# Patient Record
Sex: Male | Born: 1980 | Race: White | Hispanic: No | Marital: Married | Smoking: Current every day smoker
Health system: Southern US, Community
[De-identification: ages and names within clinical notes are randomized; demographics above are authoritative.]

## PROBLEM LIST (undated history)

## (undated) DIAGNOSIS — Z720 Tobacco use: Secondary | ICD-10-CM

## (undated) DIAGNOSIS — S0990XA Unspecified injury of head, initial encounter: Secondary | ICD-10-CM

## (undated) DIAGNOSIS — E785 Hyperlipidemia, unspecified: Secondary | ICD-10-CM

## (undated) DIAGNOSIS — I2510 Atherosclerotic heart disease of native coronary artery without angina pectoris: Secondary | ICD-10-CM

## (undated) DIAGNOSIS — I119 Hypertensive heart disease without heart failure: Secondary | ICD-10-CM

## (undated) DIAGNOSIS — I10 Essential (primary) hypertension: Secondary | ICD-10-CM

## (undated) HISTORY — DX: Essential (primary) hypertension: I10

## (undated) HISTORY — DX: Hyperlipidemia, unspecified: E78.5

## (undated) HISTORY — DX: Atherosclerotic heart disease of native coronary artery without angina pectoris: I25.10

## (undated) HISTORY — DX: Tobacco use: Z72.0

---

## 2006-06-03 ENCOUNTER — Emergency Department (HOSPITAL_COMMUNITY): Admission: EM | Admit: 2006-06-03 | Discharge: 2006-06-03 | Payer: Self-pay | Admitting: Emergency Medicine

## 2011-11-27 ENCOUNTER — Encounter: Payer: Self-pay | Admitting: *Deleted

## 2011-12-10 ENCOUNTER — Encounter: Payer: Self-pay | Admitting: Cardiology

## 2011-12-10 ENCOUNTER — Ambulatory Visit (INDEPENDENT_AMBULATORY_CARE_PROVIDER_SITE_OTHER): Payer: 59 | Admitting: Cardiology

## 2011-12-10 ENCOUNTER — Encounter: Payer: Self-pay | Admitting: *Deleted

## 2011-12-10 VITALS — BP 150/108 | HR 84 | Ht 71.0 in | Wt 193.0 lb

## 2011-12-10 DIAGNOSIS — F17200 Nicotine dependence, unspecified, uncomplicated: Secondary | ICD-10-CM

## 2011-12-10 DIAGNOSIS — R079 Chest pain, unspecified: Secondary | ICD-10-CM

## 2011-12-10 DIAGNOSIS — I10 Essential (primary) hypertension: Secondary | ICD-10-CM

## 2011-12-10 DIAGNOSIS — E785 Hyperlipidemia, unspecified: Secondary | ICD-10-CM

## 2011-12-10 MED ORDER — LISINOPRIL 20 MG PO TABS: 20.0000 mg | ORAL_TABLET | Freq: Every day | ORAL | Status: DC

## 2011-12-10 MED ORDER — BUPROPION HCL ER (SR) 150 MG PO TB12: 150.0000 mg | ORAL_TABLET | Freq: Two times a day (BID) | ORAL | Status: DC

## 2011-12-10 NOTE — Patient Instructions (Addendum)
Start lisinopril 20mg  daily.  Start aspirin 81mg  daily. Take this with food.  Start Welbutrin 150mg  two times a day to help you stop smoking. Take 1 a day for three  days,then increase to 1 two times a day. Stay on 1 two times a day.  Your physician recommends that you have  a FASTING lipid profile /BMET/CBC-d/ PT/INR on Tuesday September 3,2013. You have the order. Please fax the results to Dr Shirlee Latch [phone].   Your physician has requested that you have a cardiac catheterization. Cardiac catheterization is used to diagnose and/or treat various heart conditions. Doctors may recommend this procedure for a number of different reasons. The most common reason is to evaluate chest pain. Chest pain can be a symptom of coronary artery disease (CAD), and cardiac catheterization can show whether plaque is narrowing or blocking your heart's arteries. This procedure is also used to evaluate the valves, as well as measure the blood flow and oxygen levels in different parts of your heart. For further information please visit [email or web address] Please follow instruction sheet, as given. Friday September 6,2013.  Your physician recommends that you schedule a follow-up appointment in about  3 weeks with Dr Shirlee Latch.

## 2011-12-11 DIAGNOSIS — E785 Hyperlipidemia, unspecified: Secondary | ICD-10-CM | POA: Insufficient documentation

## 2011-12-11 DIAGNOSIS — R079 Chest pain, unspecified: Secondary | ICD-10-CM | POA: Insufficient documentation
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
## 2011-12-11 NOTE — Assessment & Plan Note (Signed)
BP is running high.  He has a strong FH of HTN and has had a diagnosis of HTN since around the age of 63.   He has not been able to tolerate HCTZ due to erectile dysfunction.  I am going to have him start lisinopril 20 mg daily.  BMET in 1 week.

## 2011-12-11 NOTE — Assessment & Plan Note (Signed)
Active smoker.  He will try Wellbutrin to see if this can help him quit.

## 2011-12-11 NOTE — Assessment & Plan Note (Signed)
Patient has exertional chest pain resolving with rest.  He had 1 episode of more severe chest pain at rest last week.  He has a very strong family history of premature CAD (multiple family members with MIs in their 30s).  He smokes and has untreated HTN and hyperlipidemia.  We had a long discussion of the pros and cons and decided on left heart cath for a definitive evaluation of the coronaries.  I will plan LHC with a radial approach next week.  He should take ASA 81 mg daily.

## 2011-12-11 NOTE — Assessment & Plan Note (Signed)
I will arrange to check lipids/LFTs.

## 2011-12-16 ENCOUNTER — Encounter: Payer: Self-pay | Admitting: Cardiology

## 2011-12-18 ENCOUNTER — Other Ambulatory Visit: Payer: Self-pay | Admitting: Cardiology

## 2011-12-18 DIAGNOSIS — R079 Chest pain, unspecified: Secondary | ICD-10-CM

## 2011-12-19 ENCOUNTER — Encounter (HOSPITAL_BASED_OUTPATIENT_CLINIC_OR_DEPARTMENT_OTHER): Payer: Self-pay | Admitting: *Deleted

## 2011-12-19 ENCOUNTER — Inpatient Hospital Stay (HOSPITAL_BASED_OUTPATIENT_CLINIC_OR_DEPARTMENT_OTHER)
Admission: RE | Admit: 2011-12-19 | Discharge: 2011-12-19 | Disposition: A | Discharge status: Home / Self Care | Payer: 59 | Source: Ambulatory Visit | Attending: Cardiology | Admitting: Cardiology

## 2011-12-19 ENCOUNTER — Encounter (HOSPITAL_BASED_OUTPATIENT_CLINIC_OR_DEPARTMENT_OTHER)
Admission: RE | Disposition: A | Discharge status: Home / Self Care | Payer: Self-pay | Source: Ambulatory Visit | Attending: Cardiology

## 2011-12-19 DIAGNOSIS — E785 Hyperlipidemia, unspecified: Secondary | ICD-10-CM | POA: Insufficient documentation

## 2011-12-19 DIAGNOSIS — Z8249 Family history of ischemic heart disease and other diseases of the circulatory system: Secondary | ICD-10-CM | POA: Insufficient documentation

## 2011-12-19 DIAGNOSIS — I2510 Atherosclerotic heart disease of native coronary artery without angina pectoris: Secondary | ICD-10-CM

## 2011-12-19 DIAGNOSIS — I10 Essential (primary) hypertension: Secondary | ICD-10-CM | POA: Insufficient documentation

## 2011-12-19 DIAGNOSIS — R079 Chest pain, unspecified: Secondary | ICD-10-CM | POA: Insufficient documentation

## 2011-12-19 HISTORY — PX: CARDIAC CATHETERIZATION: SHX172

## 2011-12-19 SURGERY — JV LEFT HEART CATHETERIZATION WITH CORONARY ANGIOGRAM
Anesthesia: LOCAL

## 2011-12-19 SURGERY — JV LEFT HEART CATHETERIZATION WITH CORONARY ANGIOGRAM
Anesthesia: Moderate Sedation

## 2011-12-19 MED ORDER — SODIUM CHLORIDE 0.9 % IJ SOLN: 3.0000 mL | Freq: Two times a day (BID) | INTRAMUSCULAR | Status: DC

## 2011-12-19 MED ORDER — ACETAMINOPHEN 325 MG PO TABS: 650.0000 mg | ORAL_TABLET | ORAL | Status: DC | PRN

## 2011-12-19 MED ORDER — ONDANSETRON HCL 4 MG/2ML IJ SOLN: 4.0000 mg | Freq: Four times a day (QID) | INTRAMUSCULAR | Status: DC | PRN

## 2011-12-19 MED ORDER — ASPIRIN 81 MG PO CHEW
324.0000 mg | CHEWABLE_TABLET | ORAL | Status: AC
  Administered 2011-12-19: 243 mg via ORAL

## 2011-12-19 MED ORDER — SODIUM CHLORIDE 0.9 % IV SOLN
INTRAVENOUS | Status: DC
  Administered 2011-12-19: 07:00:00 via INTRAVENOUS

## 2011-12-19 MED ORDER — SODIUM CHLORIDE 0.9 % IV SOLN: 250.0000 mL | INTRAVENOUS | Status: DC | PRN

## 2011-12-19 MED ORDER — SODIUM CHLORIDE 0.9 % IV SOLN: INTRAVENOUS | Status: DC

## 2011-12-19 MED ORDER — SODIUM CHLORIDE 0.9 % IJ SOLN: 3.0000 mL | INTRAMUSCULAR | Status: DC | PRN

## 2011-12-19 NOTE — Interval H&P Note (Signed)
History and Physical Interval Note:  12/19/2011 7:39 AM  Wesley Newman  has presented today for surgery, with the diagnosis of CAD  The various methods of treatment have been discussed with the patient and family. After consideration of risks, benefits and other options for treatment, the patient has consented to  Procedure(s) (LRB) with comments: JV LEFT HEART CATHETERIZATION WITH CORONARY ANGIOGRAM (N/A) as a surgical intervention .  The patient's history has been reviewed, patient examined, no change in status, stable for surgery.  I have reviewed the patient's chart and labs.  Questions were answered to the patient's satisfaction.     Jessa Stinson [employer]

## 2011-12-19 NOTE — OR Nursing (Signed)
Discharge instructions reviewed and signed, pt stated understanding, ambulated in hall without difficulty, site level 0, transported to wife's car via wheelchair 

## 2011-12-19 NOTE — OR Nursing (Signed)
TR Band removed, site intact level 0, pressure dressing and wrist splint applied

## 2011-12-19 NOTE — OR Nursing (Signed)
Meal served 

## 2011-12-19 NOTE — CV Procedure (Signed)
   Cardiac Catheterization Procedure Note  Name: Wesley Newman MRN: [id number] DOB: Mar 13, 1981  Procedure: Left Heart Cath, Selective Coronary Angiography, LV angiography  Indication: Exertional chest pain, strong family history of CAD.    Procedural Details: The right wrist was prepped, draped, and anesthetized with 1% lidocaine. Using the modified Seldinger technique, a 5 French sheath was introduced into the right radial artery. 3 mg of verapamil and 200 mcg IV nitroglycerin was administered through the sheath, weight-based unfractionated heparin was administered intravenously. Standard Judkins catheters were used for selective coronary angiography and left ventriculography. Catheter exchanges were performed over an exchange length guidewire. There were no immediate procedural complications. A TR band was used for radial hemostasis at the completion of the procedure.  The patient was transferred to the post catheterization recovery area for further monitoring.  Procedural Findings: Hemodynamics: AO 107/88 LV 111/17  Coronary angiography: Coronary dominance: right  Left mainstem: No significant disease.  Left anterior descending (LAD): Small caliber with some dilation after injection of 200 mcg intracoronary NTG.  The LAD itself has mild luminal irregularities.  There was a moderate 1st diagonal with 30-40% mid vessel stenosis.  There was a small branch off the 1st diagonal with 80% ostial stenosis.   Left circumflex (LCx): Mild luminal irregularities.   Right coronary artery (RCA): Mild luminal irregularities.  Left ventriculography: Left ventricular systolic function is normal, LVEF is estimated at 55-60%, there is no significant mitral regurgitation or wall motion abnormality in the RAO projection.  Final Conclusions:  No obstructive disease in the major vessels.  There was an 80% stenosis in a small branch vessel off D1.    Recommendations:  Nonobstructive coronary disease.   Patient needs aggressive risk factor modification including statin (will start atorvastatin 40 mg daily) and smoking cessation.   Marca Ancona 12/19/2011, 8:20 AM
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
## 2011-12-20 ENCOUNTER — Telehealth: Payer: Self-pay | Admitting: Physician Assistant

## 2011-12-20 NOTE — Telephone Encounter (Signed)
Patient called because his arm with swelling. He had a cath yesterday and was discharged home with some type of band around his wrist in place and with the splint on. Today, he has noted some generalized swelling in his forearm proximal to the band. He wanted to know when he could take the band off and when he could take the splint off.  Advised the patient he should take the band off immediately. Advised him it was okay to take the splint off as well. Advised him he should start moving his wrist around very gently. Advised him that if the swelling started going down very quickly then nothing further needed to be done at this time. However, advised him that if he noted any more swelling, any pallor, any numbness or tingling or any other new symptoms, he should come to [facility] hospital immediately. The patient stated he would comply.

## 2011-12-23 ENCOUNTER — Encounter: Payer: Self-pay | Admitting: *Deleted

## 2012-01-01 ENCOUNTER — Encounter: Payer: Self-pay | Admitting: Cardiology

## 2012-01-01 ENCOUNTER — Ambulatory Visit (INDEPENDENT_AMBULATORY_CARE_PROVIDER_SITE_OTHER): Payer: 59 | Admitting: Cardiology

## 2012-01-01 VITALS — BP 140/83 | HR 73 | Resp 18 | Ht 71.0 in | Wt 192.8 lb

## 2012-01-01 DIAGNOSIS — I2510 Atherosclerotic heart disease of native coronary artery without angina pectoris: Secondary | ICD-10-CM | POA: Insufficient documentation

## 2012-01-01 DIAGNOSIS — I25810 Atherosclerosis of coronary artery bypass graft(s) without angina pectoris: Secondary | ICD-10-CM

## 2012-01-01 DIAGNOSIS — I10 Essential (primary) hypertension: Secondary | ICD-10-CM

## 2012-01-01 DIAGNOSIS — F17200 Nicotine dependence, unspecified, uncomplicated: Secondary | ICD-10-CM

## 2012-01-01 DIAGNOSIS — E785 Hyperlipidemia, unspecified: Secondary | ICD-10-CM
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
## 2012-01-01 NOTE — Patient Instructions (Addendum)
Your physician recommends that you return for a FASTING lipid profile /liver profile /BMET in 2 months. You can have this done at the [facility] in [place]. I have given you an order for this today. Please fax the results to [phone] Dr Shirlee Latch.  Your physician wants you to follow-up in: 6 months with Dr Shirlee Latch in the [facility] office.  You will receive a reminder letter in the mail two months in advance. If you don't receive a letter, please call our office to schedule the follow-up appointment.

## 2012-04-29 ENCOUNTER — Ambulatory Visit: Payer: 59 | Admitting: Nurse Practitioner

## 2012-05-06 ENCOUNTER — Ambulatory Visit (INDEPENDENT_AMBULATORY_CARE_PROVIDER_SITE_OTHER): Payer: 59 | Admitting: Nurse Practitioner

## 2012-05-06 ENCOUNTER — Other Ambulatory Visit: Payer: Self-pay | Admitting: *Deleted

## 2012-05-06 ENCOUNTER — Encounter: Payer: Self-pay | Admitting: Nurse Practitioner

## 2012-05-06 VITALS — BP 137/76 | HR 82 | Wt 194.0 lb

## 2012-05-06 DIAGNOSIS — Z720 Tobacco use: Secondary | ICD-10-CM

## 2012-05-06 DIAGNOSIS — R079 Chest pain, unspecified: Secondary | ICD-10-CM

## 2012-05-06 DIAGNOSIS — I10 Essential (primary) hypertension: Secondary | ICD-10-CM

## 2012-05-06 DIAGNOSIS — I2510 Atherosclerotic heart disease of native coronary artery without angina pectoris: Secondary | ICD-10-CM

## 2012-05-06 DIAGNOSIS — E785 Hyperlipidemia, unspecified: Secondary | ICD-10-CM

## 2012-05-06 DIAGNOSIS — F17200 Nicotine dependence, unspecified, uncomplicated: Secondary | ICD-10-CM

## 2012-05-06 NOTE — Progress Notes (Signed)
Patient Name: Wesley Newman Date of Encounter: 05/06/2012  Primary Care Provider:  No primary provider on file. Primary Cardiologist:  Golden Circle, MD  Patient Profile  32 y/o male h/o chest pain and nonobs cad on cath who presents with atypical chest pain.  Problem List   Past Medical History  Diagnosis Date  . Essential hypertension, benign   . CAD (coronary artery disease) nonobs    a. 9.2013 Cath: LAD 30-40, D1 80 ost, otw nl, nl EF  . Atypical chest pain   . Hyperlipidemia   . Tobacco abuse    Past Surgical History  Procedure Date  . Cardiac catheterization 12/19/11    left heart with coronary angiogram    Allergies  No Known Allergies  HPI  32 year old male with the above problem list.  He is status post catheterization in September of last year revealing nonobstructive CAD.  Although he initially quit smoking surrounding his catheterization, he has resumed and is currently smoking a half a pack a day.  He reports compliance with his medications.  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  Over the past week, patient has been experiencing intermittent left lateral lower chest sharp shooting pain that generally occurs at rest and moves along the axilla and down his left arm without associated symptoms, lasting a second or 2 and resolving spontaneously.  He has also noticed that when he has his left arm in an elevated position like when he is driving down the road with his arm up on the windowsill of his truck, he develops paresthesias of the left arm.  This has also occurred when his arm was positioned above his head while he's fallen asleep.  He denies dyspnea exertion, palpitations, PND, orthopnea, dizziness, syncope, edema, or early satiety.  Home Medications  Prior to Admission medications   Medication Sig Start Date End Date Taking? Authorizing Provider  aspirin EC 81 MG tablet Take 1  tablet (81 mg total) by mouth daily. 12/10/11  Yes Laurey Morale, MD  atorvastatin (LIPITOR) 40 MG tablet Take 40 mg by mouth daily.   Yes Historical Provider, MD  lisinopril (PRINIVIL,ZESTRIL) 20 MG tablet Take 1 tablet (20 mg total) by mouth daily. 12/10/11 12/09/12 Yes Laurey Morale, MD   Review of Systems  Chest pain and left arm paresthesias as outlined above otherwise all systems reviewed and are otherwise negative except as noted above.  Physical Exam  Blood pressure 137/76, pulse 82, weight 194 lb (87.998 kg).  General: Pleasant, NAD Psych: Normal affect. Neuro: Alert and oriented X 3. Moves all extremities spontaneously. HEENT: Normal  Neck: Supple without bruits or JVD. Lungs:  Resp regular and unlabored, CTA. Heart: RRR no s3, s4, or murmurs. Abdomen: Soft, non-tender, non-distended, BS + x 4.  Extremities: No clubbing, cyanosis or edema. DP/PT/Radials 2+ and equal bilaterally.  Accessory Clinical Findings  ECG - regular sinus rhythm, 82, no acute ST-T changes.  Assessment & Plan  1.  Chest pain: Patient presents with a one-week history of intermittent sharp and shooting/fleeting chest pain.  He had catheterization in September which showed nonobstructive LAD and small vessel diagonal disease.  He has significant family history of coronary artery disease with a sister has been stented and a brother who was recently suffered cardiac arrest and subsequent stenting.  Both siblings have symptoms that started out as sharp chest pain and  his sister was found to have abnormally rapid progression of her coronary disease. As result, patient is very concerned about his symptoms despite the atypical nature.  Given his significant family history and recent diagnostic catheterization, I have discussed his case with his regular cardiologist, Dr. Shirlee Latch, and we will obtain an exercise Myoview to rule out ischemia.  He remains on aspirin and Statin therapy.  2.  Left arm paresthesias: This  is positional in nature.  I would question if he has cervical radiculopathy I recommended that he follow up with his primary care provider.  3.  Hyperlipidemia: Continue statin therapy.  4.  Ongoing tobacco abuse: Smoking cessation strongly advised.  5.  Disposition: Followup stress testing and then he'll see Dr. Jearld Pies in 3-4 weeks.    Nicolasa Ducking, NP 05/06/2012, 3:51 PM

## 2012-05-06 NOTE — Patient Instructions (Addendum)
Your physician has requested that you have en exercise stress myoview. For further information please visit [email or web address] Please follow instruction sheet, as given.   Your physician recommends that you schedule a follow-up appointment in: 3 weeks with Dr Edward Jolly and Cholesterol Control Diet Cholesterol levels in your body are determined significantly by your diet. Cholesterol levels may also be related to heart disease. The following material helps to explain this relationship and discusses what you can do to help keep your heart healthy. Not all cholesterol is bad. Low-density lipoprotein (LDL) cholesterol is the "bad" cholesterol. It may cause fatty deposits to build up inside your arteries. High-density lipoprotein (HDL) cholesterol is "good." It helps to remove the "bad" LDL cholesterol from your blood. Cholesterol is a very important risk factor for heart disease. Other risk factors are high blood pressure, smoking, stress, heredity, and weight. The heart muscle gets its supply of blood through the coronary arteries. If your LDL cholesterol is high and your HDL cholesterol is low, you are at risk for having fatty deposits build up in your coronary arteries. This leaves less room through which blood can flow. Without sufficient blood and oxygen, the heart muscle cannot function properly and you may feel chest pains (angina pectoris). When a coronary artery closes up entirely, a part of the heart muscle may die causing a heart attack (myocardial infarction). CHECKING CHOLESTEROL When your caregiver sends your blood to a lab to be examined for cholesterol, a complete lipid (fat) profile may be done. With this test, the total amount of cholesterol and levels of LDL and HDL are determined. Triglycerides are a type of fat that circulates in the blood. They can also be used to determine heart disease risk. The list below describes what the numbers should be: Test: Total Cholesterol.  Less than 200  mg/dl. Test: LDL "bad cholesterol."  Less than 100 mg/dl.  Less than 70 mg/dl if you are at very high risk of a heart attack or sudden cardiac death. Test: HDL "good cholesterol."  Greater than 50 mg/dl for women.  Greater than 40 mg/dl for men. Test: Triglycerides.  Less than 150 mg/dl. CONTROLLING CHOLESTEROL WITH DIET Although exercise and lifestyle factors are important, your diet is key. That is because certain foods are known to raise cholesterol and others to lower it. The goal is to balance foods for their effect on cholesterol and more importantly, to replace saturated and trans fat with other types of fat, such as monounsaturated fat, polyunsaturated fat, and omega-3 fatty acids. On average, a person should consume no more than 15 to 17 g of saturated fat daily. Saturated and trans fats are considered "bad" fats, and they will raise LDL cholesterol. Saturated fats are primarily found in animal products such as meats, butter, and cream. However, that does not mean you need to give up all your favorite foods. Today, there are good tasting, low-fat, low-cholesterol substitutes for most of the things you like to eat. Choose low-fat or nonfat alternatives. Choose round or loin cuts of red meat. These types of cuts are lowest in fat and cholesterol. Chicken (without the skin), fish, veal, and ground [place] breast are great choices. Eliminate fatty meats, such as hot dogs and salami. Even shellfish have little or no saturated fat. Have a 3 oz (85 g) portion when you eat lean meat, poultry, or fish. Trans fats are also called "partially hydrogenated oils." They are oils that have been scientifically manipulated so that they are solid at room  temperature resulting in a longer shelf life and improved taste and texture of foods in which they are added. Trans fats are found in stick margarine, some tub margarines, cookies, crackers, and baked goods.  When baking and cooking, oils are a great  substitute for butter. The monounsaturated oils are especially beneficial since it is believed they lower LDL and raise HDL. The oils you should avoid entirely are saturated tropical oils, such as coconut and palm.  Remember to eat a lot from food groups that are naturally free of saturated and trans fat, including fish, fruit, vegetables, beans, grains (barley, rice, couscous, bulgur wheat), and pasta (without cream sauces).  IDENTIFYING FOODS THAT LOWER CHOLESTEROL  Soluble fiber may lower your cholesterol. This type of fiber is found in fruits such as apples, vegetables such as broccoli, potatoes, and carrots, legumes such as beans, peas, and lentils, and grains such as barley. Foods fortified with plant sterols (phytosterol) may also lower cholesterol. You should eat at least 2 g per day of these foods for a cholesterol lowering effect.  Read package labels to identify low-saturated fats, trans fat free, and low-fat foods at the supermarket. Select cheeses that have only 2 to 3 g saturated fat per ounce. Use a heart-healthy tub margarine that is free of trans fats or partially hydrogenated oil. When buying baked goods (cookies, crackers), avoid partially hydrogenated oils. Breads and muffins should be made from whole grains (whole-wheat or whole oat flour, instead of "flour" or "enriched flour"). Buy non-creamy canned soups with reduced salt and no added fats.  FOOD PREPARATION TECHNIQUES  Never deep-fry. If you must fry, either stir-fry, which uses very little fat, or use non-stick cooking sprays. When possible, broil, bake, or roast meats, and steam vegetables. Instead of putting butter or margarine on vegetables, use lemon and herbs, applesauce, and cinnamon (for squash and sweet potatoes), nonfat yogurt, salsa, and low-fat dressings for salads.  LOW-SATURATED FAT / LOW-FAT FOOD SUBSTITUTES Meats / Saturated Fat (g)  Avoid: Steak, marbled (3 oz/85 g) / 11 g  Choose: Steak, lean (3 oz/85 g) / 4  g  Avoid: Hamburger (3 oz/85 g) / 7 g  Choose: Hamburger, lean (3 oz/85 g) / 5 g  Avoid: Ham (3 oz/85 g) / 6 g  Choose: Ham, lean cut (3 oz/85 g) / 2.4 g  Avoid: Chicken, with skin, dark meat (3 oz/85 g) / 4 g  Choose: Chicken, skin removed, dark meat (3 oz/85 g) / 2 g  Avoid: Chicken, with skin, light meat (3 oz/85 g) / 2.5 g  Choose: Chicken, skin removed, light meat (3 oz/85 g) / 1 g Dairy / Saturated Fat (g)  Avoid: Whole milk (1 cup) / 5 g  Choose: Low-fat milk, 2% (1 cup) / 3 g  Choose: Low-fat milk, 1% (1 cup) / 1.5 g  Choose: Skim milk (1 cup) / 0.3 g  Avoid: Hard cheese (1 oz/28 g) / 6 g  Choose: Skim milk cheese (1 oz/28 g) / 2 to 3 g  Avoid: Cottage cheese, 4% fat (1 cup) / 6.5 g  Choose: Low-fat cottage cheese, 1% fat (1 cup) / 1.5 g  Avoid: Ice cream (1 cup) / 9 g  Choose: Sherbet (1 cup) / 2.5 g  Choose: Nonfat frozen yogurt (1 cup) / 0.3 g  Choose: Frozen fruit bar / trace  Avoid: Whipped cream (1 tbs) / 3.5 g  Choose: Nondairy whipped topping (1 tbs) / 1 g Condiments / Saturated Fat (g)  Avoid: Mayonnaise (1 tbs) / 2 g  Choose: Low-fat mayonnaise (1 tbs) / 1 g  Avoid: Butter (1 tbs) / 7 g  Choose: Extra light margarine (1 tbs) / 1 g  Avoid: Coconut oil (1 tbs) / 11.8 g  Choose: Olive oil (1 tbs) / 1.8 g  Choose: Corn oil (1 tbs) / 1.7 g  Choose: Safflower oil (1 tbs) / 1.2 g  Choose: Sunflower oil (1 tbs) / 1.4 g  Choose: Soybean oil (1 tbs) / 2.4 g  Choose: Canola oil (1 tbs) / 1 g Document Released: 03/31/2005 Document Revised: 06/23/2011 Document Reviewed: 09/19/2010 [facility] Patient Information 2013 [place], [place].

## 2012-05-14 ENCOUNTER — Telehealth: Payer: Self-pay | Admitting: Nurse Practitioner

## 2012-05-14 NOTE — Telephone Encounter (Signed)
Called and left message asking Mr. Wesley Newman to call office to schedule his exercise stress myoview.

## 2012-05-20 NOTE — Telephone Encounter (Signed)
Pt has [place] [facility].  No precert required °

## 2012-05-20 NOTE — Telephone Encounter (Signed)
Spoke with Wesley Newman. His exercise stress myoview has been scheduled for 05-27-12 @ [facility] 730am. Wesley Newman Has been notified.   Checking percert for exercise stress myoview. 05-27-12 @ [facility]

## 2012-06-22 ENCOUNTER — Ambulatory Visit: Payer: 59 | Admitting: Cardiology

## 2012-06-25 DIAGNOSIS — R079 Chest pain, unspecified: Secondary | ICD-10-CM

## 2012-06-29 ENCOUNTER — Encounter: Payer: Self-pay | Admitting: *Deleted

## 2012-08-25 ENCOUNTER — Ambulatory Visit: Payer: 59 | Admitting: Cardiology

## 2013-07-26 ENCOUNTER — Encounter (HOSPITAL_COMMUNITY): Payer: Self-pay | Admitting: Emergency Medicine

## 2013-07-26 ENCOUNTER — Emergency Department (HOSPITAL_COMMUNITY): Payer: 59

## 2013-07-26 ENCOUNTER — Emergency Department (HOSPITAL_COMMUNITY)
Admission: EM | Admit: 2013-07-26 | Discharge: 2013-07-26 | Disposition: A | Discharge status: Home / Self Care | Payer: 59 | Attending: Emergency Medicine | Admitting: Emergency Medicine

## 2013-07-26 DIAGNOSIS — I2510 Atherosclerotic heart disease of native coronary artery without angina pectoris: Secondary | ICD-10-CM | POA: Insufficient documentation

## 2013-07-26 DIAGNOSIS — IMO0002 Reserved for concepts with insufficient information to code with codable children: Secondary | ICD-10-CM | POA: Insufficient documentation

## 2013-07-26 DIAGNOSIS — Z7982 Long term (current) use of aspirin: Secondary | ICD-10-CM | POA: Insufficient documentation

## 2013-07-26 DIAGNOSIS — Z79899 Other long term (current) drug therapy: Secondary | ICD-10-CM | POA: Insufficient documentation

## 2013-07-26 DIAGNOSIS — S0990XA Unspecified injury of head, initial encounter: Secondary | ICD-10-CM | POA: Insufficient documentation

## 2013-07-26 DIAGNOSIS — Y9289 Other specified places as the place of occurrence of the external cause: Secondary | ICD-10-CM | POA: Insufficient documentation

## 2013-07-26 DIAGNOSIS — Z23 Encounter for immunization: Secondary | ICD-10-CM | POA: Insufficient documentation

## 2013-07-26 DIAGNOSIS — Z9889 Other specified postprocedural states: Secondary | ICD-10-CM | POA: Insufficient documentation

## 2013-07-26 DIAGNOSIS — Y9389 Activity, other specified: Secondary | ICD-10-CM | POA: Insufficient documentation

## 2013-07-26 DIAGNOSIS — S0180XA Unspecified open wound of other part of head, initial encounter: Secondary | ICD-10-CM | POA: Insufficient documentation

## 2013-07-26 DIAGNOSIS — S0181XA Laceration without foreign body of other part of head, initial encounter: Secondary | ICD-10-CM

## 2013-07-26 DIAGNOSIS — E785 Hyperlipidemia, unspecified: Secondary | ICD-10-CM | POA: Insufficient documentation

## 2013-07-26 DIAGNOSIS — I10 Essential (primary) hypertension: Secondary | ICD-10-CM | POA: Insufficient documentation

## 2013-07-26 DIAGNOSIS — F17200 Nicotine dependence, unspecified, uncomplicated: Secondary | ICD-10-CM | POA: Insufficient documentation

## 2013-07-26 MED ORDER — LIDOCAINE-EPINEPHRINE-TETRACAINE (LET) SOLUTION
NASAL | Status: AC
  Filled 2013-07-26: qty 3

## 2013-07-26 MED ORDER — LIDOCAINE-EPINEPHRINE-TETRACAINE (LET) SOLUTION
3.0000 mL | Freq: Once | NASAL | Status: AC
  Administered 2013-07-26: 3 mL via TOPICAL

## 2013-07-26 MED ORDER — TETANUS-DIPHTH-ACELL PERTUSSIS 5-2.5-18.5 LF-MCG/0.5 IM SUSP
0.5000 mL | Freq: Once | INTRAMUSCULAR | Status: AC
Start: 2013-07-26 — End: 2013-07-26
  Administered 2013-07-26: 0.5 mL via INTRAMUSCULAR
  Filled 2013-07-26: qty 0.5

## 2013-07-26 MED ORDER — ACETAMINOPHEN 500 MG PO TABS
1000.0000 mg | ORAL_TABLET | Freq: Once | ORAL | Status: AC
  Administered 2013-07-26: 1000 mg via ORAL
  Filled 2013-07-26: qty 2

## 2013-07-26 MED ORDER — LIDOCAINE-EPINEPHRINE (PF) 2 %-1:200000 IJ SOLN
INTRAMUSCULAR | Status: AC
  Administered 2013-07-26: 18:00:00
  Filled 2013-07-26: qty 20

## 2013-07-26 NOTE — ED Notes (Addendum)
Laceration to lt forehead, No LOC, alert, ambulatory into ER, PERL, EOMI
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
## 2013-07-26 NOTE — Discharge Instructions (Signed)
Facial Laceration ° A facial laceration is a cut on the face. These injuries can be painful and cause bleeding. Lacerations usually heal quickly, but they need special care to reduce scarring. °DIAGNOSIS  °Your health care provider will take a medical history, ask for details about how the injury occurred, and examine the wound to determine how deep the cut is. °TREATMENT  °Some facial lacerations may not require closure. Others may not be able to be closed because of an increased risk of infection. The risk of infection and the chance for successful closure will depend on various factors, including the amount of time since the injury occurred. °The wound may be cleaned to help prevent infection. If closure is appropriate, pain medicines may be given if needed. Your health care provider will use stitches (sutures), wound glue (adhesive), or skin adhesive strips to repair the laceration. These tools bring the skin edges together to allow for faster healing and a better cosmetic outcome. If needed, you may also be given a tetanus shot. °HOME CARE INSTRUCTIONS °· Only take over-the-counter or prescription medicines as directed by your health care provider. °· Follow your health care provider's instructions for wound care. These instructions will vary depending on the technique used for closing the wound. °For Sutures: °· Keep the wound clean and dry.   °· If you were given a bandage (dressing), you should change it at least once a day. Also change the dressing if it becomes wet or dirty, or as directed by your health care provider.   °· Wash the wound with soap and water 2 times a day. Rinse the wound off with water to remove all soap. Pat the wound dry with a clean towel.   °· After cleaning, apply a thin layer of the antibiotic ointment recommended by your health care provider. This will help prevent infection and keep the dressing from sticking.   °· You may shower as usual after the first 24 hours. Do not soak the  wound in water until the sutures are removed.   °· Get your sutures removed as directed by your health care provider. With facial lacerations, sutures should usually be taken out after 4 5 days to avoid stitch marks.   °· Wait a few days after your sutures are removed before applying any makeup. °For Skin Adhesive Strips: °· Keep the wound clean and dry.   °· Do not get the skin adhesive strips wet. You may bathe carefully, using caution to keep the wound dry.   °· If the wound gets wet, pat it dry with a clean towel.   °· Skin adhesive strips will fall off on their own. You may trim the strips as the wound heals. Do not remove skin adhesive strips that are still stuck to the wound. They will fall off in time.   °For Wound Adhesive: °· You may briefly wet your wound in the shower or bath. Do not soak or scrub the wound. Do not swim. Avoid periods of heavy sweating until the skin adhesive has fallen off on its own. After showering or bathing, gently pat the wound dry with a clean towel.   °· Do not apply liquid medicine, cream medicine, ointment medicine, or makeup to your wound while the skin adhesive is in place. This may loosen the film before your wound is healed.   °· If a dressing is placed over the wound, be careful not to apply tape directly over the skin adhesive. This may cause the adhesive to be pulled off before the wound is healed.   °·   Avoid prolonged exposure to sunlight or tanning lamps while the skin adhesive is in place. °· The skin adhesive will usually remain in place for 5 10 days, then naturally fall off the skin. Do not pick at the adhesive film.   °After Healing: °Once the wound has healed, cover the wound with sunscreen during the day for 1 full year. This can help minimize scarring. Exposure to ultraviolet light in the first year will darken the scar. It can take 1 2 years for the scar to lose its redness and to heal completely.  °SEEK IMMEDIATE MEDICAL CARE IF: °· You have redness, pain, or  swelling around the wound.   °· You see a yellowish-white fluid (pus) coming from the wound.   °· You have chills or a fever.   °MAKE SURE YOU: °· Understand these instructions. °· Will watch your condition. °· Will get help right away if you are not doing well or get worse. °Document Released: 05/08/2004 Document Revised: 01/19/2013 Document Reviewed: 11/11/2012 °[employer]® Patient Information ©2014 [employer]. ° °Head Injury, Adult °You have received a head injury. It does not appear serious at this time. Headaches and vomiting are common following head injury. It should be easy to awaken from sleeping. Sometimes it is necessary for you to stay in the emergency department for a while for observation. Sometimes admission to the hospital may be needed. After injuries such as yours, most problems occur within the first 24 hours, but side effects may occur up to 7 10 days after the injury. It is important for you to carefully monitor your condition and contact your health care provider or seek immediate medical care if there is a change in your condition. °WHAT ARE THE TYPES OF HEAD INJURIES? °Head injuries can be as minor as a bump. Some head injuries can be more severe. More severe head injuries include: °· A jarring injury to the brain (concussion). °· A bruise of the brain (contusion). This mean there is bleeding in the brain that can cause swelling. °· A cracked skull (skull fracture). °· Bleeding in the brain that collects, clots, and forms a bump (hematoma). °WHAT CAUSES A HEAD INJURY? °A serious head injury is most likely to happen to someone who is in a car wreck and is not wearing a seat belt. Other causes of major head injuries include bicycle or motorcycle accidents, sports injuries, and falls. °HOW ARE HEAD INJURIES DIAGNOSED? °A complete history of the event leading to the injury and your current symptoms will be helpful in diagnosing head injuries. Many times, pictures of the brain, such as CT or MRI  are needed to see the extent of the injury. Often, an overnight hospital stay is necessary for observation.  °WHEN SHOULD I SEEK IMMEDIATE MEDICAL CARE?  °You should get help right away if: °· You have confusion or drowsiness. °· You feel sick to your stomach (nauseous) or have continued, forceful vomiting. °· You have dizziness or unsteadiness that is getting worse. °· You have severe, continued headaches not relieved by medicine. Only take over-the-counter or prescription medicines for pain, fever, or discomfort as directed by your health care provider. °· You do not have normal function of the arms or legs or are unable to walk. °· You notice changes in the black spots in the center of the colored part of your eye (pupil). °· You have a clear or bloody fluid coming from your nose or ears. °· You have a loss of vision. °During the next 24 hours after the injury,   you must stay with someone who can watch you for the warning signs. This person should contact local emergency services (911 in the [place] if you have seizures, you become unconscious, or you are unable to wake up. °HOW CAN I PREVENT A HEAD INJURY IN THE FUTURE? °The most important factor for preventing major head injuries is avoiding motor vehicle accidents.  To minimize the potential for damage to your head, it is crucial to wear seat belts while riding in motor vehicles. Wearing helmets while bike riding and playing collision sports (like football) is also helpful. Also, avoiding dangerous activities around the house will further help reduce your risk of head injury.  °WHEN CAN I RETURN TO NORMAL ACTIVITIES AND ATHLETICS? °You should be reevaluated by your health care provider before returning to these activities. If you have any of the following symptoms, you should not return to activities or contact sports until 1 week after the symptoms have stopped: °· Persistent headache. °· Dizziness or vertigo. °· Poor attention and  concentration. °· Confusion. °· Memory problems. °· Nausea or vomiting. °· Fatigue or tire easily. °· Irritability. °· Intolerant of bright lights or loud noises. °· Anxiety or depression. °· Disturbed sleep. °MAKE SURE YOU:  °· Understand these instructions. °· Will watch your condition. °· Will get help right away if you are not doing well or get worse. °Document Released: 03/31/2005 Document Revised: 01/19/2013 Document Reviewed: 12/06/2012 °Exit[employer]® Patient Information ©2014 [employer]. ° °
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
## 2013-07-31 NOTE — ED Provider Notes (Signed)
Medical screening examination/treatment/procedure(s) were performed by non-physician practitioner and as supervising physician I was immediately available for consultation/collaboration.  Hurman HornJohn M Daniya Aramburo, MD 07/31/13 [phone]

## 2014-10-30 ENCOUNTER — Telehealth: Payer: Self-pay | Admitting: Cardiology

## 2014-10-30 NOTE — Telephone Encounter (Signed)
New Message  Pt's sister calling to speak w/ RN about pt's current BP. Last couple readings- 150s/110s. Pt sister did not have exact number. Pt has next avail PA appt- 8/8 w/ Elon JesterMichele. Please call back and discuss.

## 2014-10-30 NOTE — Telephone Encounter (Signed)
Follow up ° ° ° °Pt returning Anne's call.   °

## 2014-10-30 NOTE — Telephone Encounter (Signed)
Pt was told when he had his physical that Dr Shirlee LatchMcLean would need to refill his medication.  Pt is requesting  that Dr Shirlee LatchMcLean refill his lisinopril 20mg  daily until his appt with Jacolyn ReedyMichele Lenze, PA 11/20/2014. Pt states he has also been out of atorvastatin and would like to get that refilled also.  Pt advised I will forward to Dr Shirlee LatchMcLean for review.

## 2014-10-30 NOTE — Telephone Encounter (Signed)
Refill meds please

## 2014-10-30 NOTE — Telephone Encounter (Signed)
Wesley Newman advised I do not see DPR on file. Wesley Newman states she will ask pt to call our office.

## 2014-10-30 NOTE — Telephone Encounter (Signed)
Pt states he ran out of his BP medication about a month ago.  Pt states he had a physical last week and his BP was 150/100, 148/101.

## 2014-10-31 MED ORDER — LISINOPRIL 20 MG PO TABS
20.0000 mg | ORAL_TABLET | Freq: Every day | ORAL | Status: DC
Start: 2014-10-31 — End: 2015-02-22

## 2014-10-31 MED ORDER — ATORVASTATIN CALCIUM 40 MG PO TABS: 40.0000 mg | ORAL_TABLET | Freq: Every day | ORAL | Status: DC

## 2014-10-31 NOTE — Telephone Encounter (Signed)
LMTCB for pt to let him know refill for lisinopril and atorvastatin have been sent to [facility] in [place].

## 2014-10-31 NOTE — Telephone Encounter (Signed)
Pt advised.

## 2014-11-20 ENCOUNTER — Ambulatory Visit: Payer: 59 | Admitting: Physician Assistant

## 2015-02-22 ENCOUNTER — Other Ambulatory Visit: Payer: Self-pay

## 2015-02-22 MED ORDER — LISINOPRIL 20 MG PO TABS: 20.0000 mg | ORAL_TABLET | Freq: Every day | ORAL | Status: DC

## 2015-02-22 MED ORDER — ATORVASTATIN CALCIUM 40 MG PO TABS: 40.0000 mg | ORAL_TABLET | Freq: Every day | ORAL | Status: DC

## 2015-02-22 NOTE — Telephone Encounter (Signed)
1 month refill given for Atorvastatin and Lisinopril until upcoming appt.

## 2015-02-22 NOTE — Telephone Encounter (Signed)
[facility] stating that [facility] needs to establish with [facility] due to transporation issues. He states that he has been out of his blood pressure for several  Months now. I have set him up to see one of our doctors. Mr. Wesley Newman wants to know if he can have enough medication called in until his upcoming appointment.  States that his blood Pressure is running high.  He needs  Lisinopril and Atorvastation.  Pharm[facility] change [facility] [address], [place], [place]   Phone # [phone] ([facility])

## 2015-03-15 ENCOUNTER — Encounter: Payer: Self-pay | Admitting: Cardiovascular Disease

## 2015-03-15 ENCOUNTER — Ambulatory Visit: Payer: 59 | Admitting: Cardiovascular Disease

## 2015-03-15 DIAGNOSIS — R0989 Other specified symptoms and signs involving the circulatory and respiratory systems: Secondary | ICD-10-CM

## 2015-10-30 ENCOUNTER — Telehealth: Payer: Self-pay | Admitting: *Deleted

## 2015-10-30 NOTE — Telephone Encounter (Signed)
I did not leave a message on Wesley Newman's phone because it sounded like a work line and I;m not sure who else has access to the answering service.

## 2015-11-16 ENCOUNTER — Ambulatory Visit (INDEPENDENT_AMBULATORY_CARE_PROVIDER_SITE_OTHER): Payer: 59 | Admitting: Cardiology

## 2015-11-16 ENCOUNTER — Encounter: Payer: Self-pay | Admitting: Cardiology

## 2015-11-16 ENCOUNTER — Encounter (INDEPENDENT_AMBULATORY_CARE_PROVIDER_SITE_OTHER): Payer: Self-pay

## 2015-11-16 VITALS — BP 136/80 | HR 81 | Ht 71.0 in | Wt 198.0 lb

## 2015-11-16 DIAGNOSIS — R079 Chest pain, unspecified: Secondary | ICD-10-CM

## 2015-11-16 DIAGNOSIS — I208 Other forms of angina pectoris: Secondary | ICD-10-CM

## 2015-11-16 DIAGNOSIS — I739 Peripheral vascular disease, unspecified: Secondary | ICD-10-CM | POA: Diagnosis not present

## 2015-11-16 DIAGNOSIS — I10 Essential (primary) hypertension: Secondary | ICD-10-CM | POA: Diagnosis not present

## 2015-11-16 MED ORDER — ATORVASTATIN CALCIUM 40 MG PO TABS: 40.0000 mg | ORAL_TABLET | Freq: Every day | ORAL | 0 refills | Status: DC

## 2015-11-16 MED ORDER — BUPROPION HCL ER (SR) 150 MG PO TB12: ORAL_TABLET | ORAL | 2 refills | Status: DC

## 2015-11-16 MED ORDER — LISINOPRIL 20 MG PO TABS: 20.0000 mg | ORAL_TABLET | Freq: Every day | ORAL | 0 refills | Status: DC

## 2015-11-16 NOTE — Patient Instructions (Addendum)
Medication Instructions:  Use Welbutrin to help you stop smoking. Take Welbutrin 150mg  by mouth daily for 3 days then increase to Welbutrin 150mg  two times daily. Labwork: BMET/CBCd/PT/INR/Lipid profile in about 10 days.  Testing/Procedures: Your physician has requested that you have a lower  extremity arterial duplex. This test is an ultrasound of the arteries in the legs . It looks at arterial blood flow in the legs . Allow one hour for Lower and Upper Arterial scans. There are no restrictions or special instructions   Follow-Up: Your physician recommends that you schedule a follow-up appointment in: 1 month with the PA/NP.    Any Other Special Instructions Will Be Listed Below (If Applicable).  Your physician has requested that you have a cardiac catheterization. Cardiac catheterization is used to diagnose and/or treat various heart conditions. Doctors may recommend this procedure for a number of different reasons. The most common reason is to evaluate chest pain. Chest pain can be a symptom of coronary artery disease (CAD), and cardiac catheterization can show whether plaque is narrowing or blocking your heart's arteries. This procedure is also used to evaluate the valves, as well as measure the blood flow and oxygen levels in different parts of your heart. For further information please visit [email or web address] Please follow instruction sheet, as given. This will be scheduled the week of August 14,2017     If you need a refill on your cardiac medications before your next appointment, please call your pharmacy.    I spoke with patient 11/26/15--pt aware LHC has been scheduled for 11/29/15 12 Noon, arrive 10AM, pt advised pre cath  lab has been scheduled for 11/27/15, instructions for LHC reviewed over telephone and copy left at front desk for pt to pick up 11/27/15 when he comes for lab.

## 2015-11-18 DIAGNOSIS — I208 Other forms of angina pectoris: Secondary | ICD-10-CM | POA: Insufficient documentation
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
## 2015-11-19 NOTE — Addendum Note (Signed)
Addended by: Jacqlyn KraussLANKFORD, ANNE M on: 11/19/2015 07:45 AM   Modules accepted: Orders

## 2015-11-26 ENCOUNTER — Encounter: Payer: Self-pay | Admitting: *Deleted

## 2015-11-27 ENCOUNTER — Other Ambulatory Visit (INDEPENDENT_AMBULATORY_CARE_PROVIDER_SITE_OTHER): Payer: 59

## 2015-11-27 DIAGNOSIS — R079 Chest pain, unspecified: Secondary | ICD-10-CM | POA: Diagnosis not present

## 2015-11-27 DIAGNOSIS — I739 Peripheral vascular disease, unspecified: Secondary | ICD-10-CM

## 2015-11-28 LAB — LIPID PANEL
Cholesterol: 128 mg/dL (ref 125–200)
HDL: 46 mg/dL (ref >=40)
LDL CALC: 54 mg/dL (ref <130)
Total CHOL/HDL Ratio: 2.8 Ratio (ref <=5.0)
Triglycerides: 142 mg/dL (ref <150)
VLDL: 28 mg/dL (ref <30)

## 2015-11-28 LAB — BASIC METABOLIC PANEL
BUN: 11 mg/dL (ref 7–25)
CALCIUM: 9.4 mg/dL (ref 8.6–10.3)
CO2: 27 mmol/L (ref 20–31)
CREATININE: 1.03 mg/dL (ref 0.60–1.35)
Chloride: 102 mmol/L (ref 98–110)
Glucose, Bld: 99 mg/dL (ref 65–99)
Potassium: 4.2 mmol/L (ref 3.5–5.3)
Sodium: 139 mmol/L (ref 135–146)

## 2015-11-28 LAB — CBC WITH DIFFERENTIAL/PLATELET
BASOS PCT: 0 %
Basophils Absolute: 0 cells/uL (ref 0–200)
EOS PCT: 5 %
Eosinophils Absolute: 385 cells/uL (ref 15–500)
HCT: 47.9 % (ref 38.5–50.0)
Hemoglobin: 16.2 g/dL (ref 13.2–17.1)
Lymphs Abs: 2541 cells/uL (ref 850–3900)
MCH: 30.2 pg (ref 27.0–33.0)
MCHC: 33.8 g/dL (ref 32.0–36.0)
MCV: 89.2 fL (ref 80.0–100.0)
MPV: 10 fL (ref 7.5–12.5)
Monocytes Absolute: 308 cells/uL (ref 200–950)
Monocytes Relative: 4 %
NEUTROS ABS: 4466 {cells}/uL (ref 1500–7800)
Neutrophils Relative %: 58 %
PLATELETS: 272 10*3/uL (ref 140–400)
RBC: 5.37 MIL/uL (ref 4.20–5.80)
RDW: 14.5 % (ref 11.0–15.0)
WBC: 7.7 10*3/uL (ref 3.8–10.8)

## 2015-11-28 LAB — PROTIME-INR
INR: 1
PROTHROMBIN TIME: 10.8 s (ref 9.0–11.5)

## 2015-11-29 ENCOUNTER — Encounter (HOSPITAL_COMMUNITY)
Admission: RE | Disposition: A | Discharge status: Home / Self Care | Payer: Self-pay | Source: Ambulatory Visit | Attending: Cardiology

## 2015-11-29 ENCOUNTER — Ambulatory Visit (HOSPITAL_COMMUNITY)
Admission: RE | Admit: 2015-11-29 | Discharge: 2015-11-29 | Disposition: A | Discharge status: Home / Self Care | Payer: 59 | Source: Ambulatory Visit | Attending: Cardiology | Admitting: Cardiology

## 2015-11-29 DIAGNOSIS — Z8249 Family history of ischemic heart disease and other diseases of the circulatory system: Secondary | ICD-10-CM | POA: Diagnosis not present

## 2015-11-29 DIAGNOSIS — F17210 Nicotine dependence, cigarettes, uncomplicated: Secondary | ICD-10-CM | POA: Insufficient documentation

## 2015-11-29 DIAGNOSIS — I10 Essential (primary) hypertension: Secondary | ICD-10-CM | POA: Insufficient documentation

## 2015-11-29 DIAGNOSIS — Z7982 Long term (current) use of aspirin: Secondary | ICD-10-CM | POA: Insufficient documentation

## 2015-11-29 DIAGNOSIS — I2510 Atherosclerotic heart disease of native coronary artery without angina pectoris: Secondary | ICD-10-CM | POA: Insufficient documentation

## 2015-11-29 DIAGNOSIS — Z87820 Personal history of traumatic brain injury: Secondary | ICD-10-CM | POA: Diagnosis not present

## 2015-11-29 DIAGNOSIS — R0609 Other forms of dyspnea: Secondary | ICD-10-CM | POA: Insufficient documentation

## 2015-11-29 DIAGNOSIS — E785 Hyperlipidemia, unspecified: Secondary | ICD-10-CM | POA: Diagnosis not present

## 2015-11-29 HISTORY — PX: CARDIAC CATHETERIZATION: SHX172

## 2015-11-29 SURGERY — LEFT HEART CATH AND CORONARY ANGIOGRAPHY

## 2015-11-29 MED ORDER — SODIUM CHLORIDE 0.9% FLUSH: 3.0000 mL | Freq: Two times a day (BID) | INTRAVENOUS | Status: DC

## 2015-11-29 MED ORDER — IOPAMIDOL (ISOVUE-370) INJECTION 76%
INTRAVENOUS | Status: DC | PRN
  Administered 2015-11-29: 70 mL via INTRA_ARTERIAL

## 2015-11-29 MED ORDER — SODIUM CHLORIDE 0.9% FLUSH: 3.0000 mL | INTRAVENOUS | Status: DC | PRN

## 2015-11-29 MED ORDER — SODIUM CHLORIDE 0.9 % IV SOLN: 250.0000 mL | INTRAVENOUS | Status: DC | PRN

## 2015-11-29 MED ORDER — HEPARIN (PORCINE) IN NACL 2-0.9 UNIT/ML-% IJ SOLN
INTRAMUSCULAR | Status: DC | PRN
Start: 2015-11-29 — End: 2015-11-29
  Administered 2015-11-29: 1000 mL

## 2015-11-29 MED ORDER — LIDOCAINE HCL (PF) 1 % IJ SOLN
INTRAMUSCULAR | Status: DC | PRN
  Administered 2015-11-29: 2 mL

## 2015-11-29 MED ORDER — IOPAMIDOL (ISOVUE-370) INJECTION 76%
INTRAVENOUS | Status: AC
  Filled 2015-11-29: qty 100

## 2015-11-29 MED ORDER — FENTANYL CITRATE (PF) 100 MCG/2ML IJ SOLN
INTRAMUSCULAR | Status: AC
  Filled 2015-11-29: qty 2

## 2015-11-29 MED ORDER — VERAPAMIL HCL 2.5 MG/ML IV SOLN
INTRAVENOUS | Status: AC
  Filled 2015-11-29: qty 2

## 2015-11-29 MED ORDER — LIDOCAINE HCL (PF) 1 % IJ SOLN
INTRAMUSCULAR | Status: AC
  Filled 2015-11-29: qty 30

## 2015-11-29 MED ORDER — HEPARIN (PORCINE) IN NACL 2-0.9 UNIT/ML-% IJ SOLN
INTRAMUSCULAR | Status: AC
  Filled 2015-11-29: qty 1000

## 2015-11-29 MED ORDER — HEPARIN SODIUM (PORCINE) 1000 UNIT/ML IJ SOLN
INTRAMUSCULAR | Status: DC | PRN
  Administered 2015-11-29: 4500 [IU] via INTRAVENOUS

## 2015-11-29 MED ORDER — SODIUM CHLORIDE 0.9 % WEIGHT BASED INFUSION: 3.0000 mL/kg/h | INTRAVENOUS | Status: DC

## 2015-11-29 MED ORDER — ONDANSETRON HCL 4 MG/2ML IJ SOLN: 4.0000 mg | Freq: Four times a day (QID) | INTRAMUSCULAR | Status: DC | PRN

## 2015-11-29 MED ORDER — MIDAZOLAM HCL 2 MG/2ML IJ SOLN
INTRAMUSCULAR | Status: AC
  Filled 2015-11-29: qty 2

## 2015-11-29 MED ORDER — ACETAMINOPHEN 325 MG PO TABS: 650.0000 mg | ORAL_TABLET | ORAL | Status: DC | PRN

## 2015-11-29 MED ORDER — ISOSORBIDE MONONITRATE ER 30 MG PO TB24: 30.0000 mg | ORAL_TABLET | Freq: Every day | ORAL | Status: DC

## 2015-11-29 MED ORDER — FENTANYL CITRATE (PF) 100 MCG/2ML IJ SOLN
INTRAMUSCULAR | Status: DC | PRN
  Administered 2015-11-29 (×3): 25 ug via INTRAVENOUS

## 2015-11-29 MED ORDER — SODIUM CHLORIDE 0.9 % WEIGHT BASED INFUSION: 1.0000 mL/kg/h | INTRAVENOUS | Status: DC

## 2015-11-29 MED ORDER — SODIUM CHLORIDE 0.9 % WEIGHT BASED INFUSION
3.0000 mL/kg/h | INTRAVENOUS | Status: DC
  Administered 2015-11-29: 3 mL/kg/h via INTRAVENOUS

## 2015-11-29 MED ORDER — HEPARIN SODIUM (PORCINE) 1000 UNIT/ML IJ SOLN
INTRAMUSCULAR | Status: AC
  Filled 2015-11-29: qty 1

## 2015-11-29 MED ORDER — VERAPAMIL HCL 2.5 MG/ML IV SOLN
INTRAVENOUS | Status: DC | PRN
  Administered 2015-11-29: 10 mL via INTRA_ARTERIAL

## 2015-11-29 MED ORDER — ASPIRIN 81 MG PO CHEW
CHEWABLE_TABLET | ORAL | Status: AC
  Filled 2015-11-29: qty 1

## 2015-11-29 MED ORDER — ASPIRIN 81 MG PO CHEW
81.0000 mg | CHEWABLE_TABLET | ORAL | Status: AC
  Administered 2015-11-29: 81 mg via ORAL

## 2015-11-29 MED ORDER — MIDAZOLAM HCL 2 MG/2ML IJ SOLN
INTRAMUSCULAR | Status: DC | PRN
  Administered 2015-11-29 (×3): 1 mg via INTRAVENOUS

## 2015-11-29 SURGICAL SUPPLY — 12 items
CATH INFINITI 5 FR JL3.5 (CATHETERS) ×3 IMPLANT
CATH INFINITI 5FR ANG PIGTAIL (CATHETERS) ×3 IMPLANT
CATH INFINITI JR4 5F (CATHETERS) ×3 IMPLANT
DEVICE RAD COMP TR BAND LRG (VASCULAR PRODUCTS) ×3 IMPLANT
GLIDESHEATH SLEND SS 6F .021 (SHEATH) ×3 IMPLANT
KIT HEART LEFT (KITS) ×3 IMPLANT
PACK CARDIAC CATHETERIZATION (CUSTOM PROCEDURE TRAY) ×3 IMPLANT
SYR MEDRAD MARK V 150ML (SYRINGE) ×3 IMPLANT
TRANSDUCER W/STOPCOCK (MISCELLANEOUS) ×3 IMPLANT
TUBING CIL FLEX 10 FLL-RA (TUBING) ×3 IMPLANT
WIRE HI TORQ VERSACORE-J 145CM (WIRE) ×3 IMPLANT
WIRE SAFE-T 1.5MM-J .035X260CM (WIRE) ×3 IMPLANT
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
## 2015-11-29 NOTE — Research (Signed)
CADLAD Informed Consent   Subject Name: Wesley Newman  Subject met inclusion and exclusion criteria.  The informed consent form, study requirements and expectations were reviewed with the subject and questions and concerns were addressed prior to the signing of the consent form.  The subject verbalized understanding of the trail requirements.  The subject agreed to participate in the CADLAD trial and signed the informed consent.  The informed consent was obtained prior to performance of any protocol-specific procedures for the subject.  A copy of the signed informed consent was given to the subject and a copy was placed in the subject's medical record.  [name] 11/29/2015, 11:45  

## 2015-11-29 NOTE — Interval H&P Note (Signed)
Cath Lab Visit (complete for each Cath Lab visit)  Clinical Evaluation Leading to the Procedure:   ACS: No.  Non-ACS:    Anginal Classification: CCS III  Anti-ischemic medical therapy: No Therapy  Non-Invasive Test Results: No non-invasive testing performed  Prior CABG: No previous CABG      History and Physical Interval Note:  11/29/2015 12:53 PM  Drury Sallye OberG Mandel  has presented today for surgery, with the diagnosis of cp  The various methods of treatment have been discussed with the patient and family. After consideration of risks, benefits and other options for treatment, the patient has consented to  Procedure(s): Left Heart Cath and Coronary Angiography (N/A) as a surgical intervention .  The patient's history has been reviewed, patient examined, no change in status, stable for surgery.  I have reviewed the patient's chart and labs.  Questions were answered to the patient's satisfaction.     Sharmane Dame [employer]Lean

## 2015-11-29 NOTE — Discharge Instructions (Signed)
Radial Site Care °Refer to this sheet in the next few weeks. These instructions provide you with information about caring for yourself after your procedure. Your health care provider may also give you more specific instructions. Your treatment has been planned according to current medical practices, but problems sometimes occur. Call your health care provider if you have any problems or questions after your procedure. °WHAT TO EXPECT AFTER THE PROCEDURE °After your procedure, it is typical to have the following: °· Bruising at the radial site that usually fades within 1-2 weeks. °· Blood collecting in the tissue (hematoma) that may be painful to the touch. It should usually decrease in size and tenderness within 1-2 weeks. °HOME CARE INSTRUCTIONS °· Take medicines only as directed by your health care provider. °· You may shower 24-48 hours after the procedure or as directed by your health care provider. Remove the bandage (dressing) and gently wash the site with plain soap and water. Pat the area dry with a clean towel. Do not rub the site, because this may cause bleeding. °· Do not take baths, swim, or use a hot tub until your health care provider approves. °· Check your insertion site every day for redness, swelling, or drainage. °· Do not apply powder or lotion to the site. °· Do not flex or bend the affected arm for 24 hours or as directed by your health care provider. °· Do not push or pull heavy objects with the affected arm for 24 hours or as directed by your health care provider. °· Do not lift over 10 lb (4.5 kg) for 5 days after your procedure or as directed by your health care provider. °· Ask your health care provider when it is okay to: °¨ Return to work or school. °¨ Resume usual physical activities or sports. °¨ Resume sexual activity. °· Do not drive home if you are discharged the same day as the procedure. Have someone else drive you. °· You may drive 24 hours after the procedure unless otherwise  instructed by your health care provider. °· Do not operate machinery or power tools for 24 hours after the procedure. °· If your procedure was done as an outpatient procedure, which means that you went home the same day as your procedure, a responsible adult should be with you for the first 24 hours after you arrive home. °· Keep all follow-up visits as directed by your health care provider. This is important. °SEEK MEDICAL CARE IF: °· You have a fever. °· You have chills. °· You have increased bleeding from the radial site. Hold pressure on the site. °SEEK IMMEDIATE MEDICAL CARE IF: °· You have unusual pain at the radial site. °· You have redness, warmth, or swelling at the radial site. °· You have drainage (other than a small amount of blood on the dressing) from the radial site. °· The radial site is bleeding, and the bleeding does not stop after 30 minutes of holding steady pressure on the site. °· Your arm or hand becomes pale, cool, tingly, or numb. °  °This information is not intended to replace advice given to you by your health care provider. Make sure you discuss any questions you have with your health care provider. °  °Document Released: 05/03/2010 Document Revised: 04/21/2014 Document Reviewed: 10/17/2013 °Elsevier Interactive Patient Education ©2016 [employer] ° °

## 2015-11-30 ENCOUNTER — Encounter (HOSPITAL_COMMUNITY): Payer: Self-pay | Admitting: Cardiology

## 2015-12-04 ENCOUNTER — Other Ambulatory Visit: Payer: Self-pay | Admitting: Cardiology

## 2015-12-04 DIAGNOSIS — I739 Peripheral vascular disease, unspecified: Secondary | ICD-10-CM

## 2015-12-07 ENCOUNTER — Telehealth: Payer: Self-pay | Admitting: Physician Assistant

## 2015-12-07 DIAGNOSIS — I25119 Atherosclerotic heart disease of native coronary artery with unspecified angina pectoris: Secondary | ICD-10-CM

## 2015-12-07 NOTE — Telephone Encounter (Signed)
Lmtcb so that I may advise pt of recommendation per Dr. Gerda DissMcLean/Scott W. PA pt will need ETT Myoview on medical therapy. Pt has appt 9/20 with Bing NeighborsScott W. PA who states test to be done somtime before that appt.

## 2015-12-07 NOTE — Telephone Encounter (Signed)
Patient recently had a heart cath with Dr. Marca Anconaalton McLean. Dr. Shirlee LatchMcLean needs him to do an Exercise Myoview on medical therapy. He sees me 01/02/16 for follow up. The stress test needs to be done before he sees me. I have placed the order. Please call patient to arrange ETT-Myoview. Tereso NewcomerScott Rhyse Skowron, PA-C   12/07/2015 12:26 PM

## 2015-12-10 NOTE — Telephone Encounter (Signed)
Pt advised per Dr. Shirlee LatchMcLean and Bing NeighborsScott W. PA that he will need ETT Myoview on medical therapy. Pt agreeable. Pt advised [facility] will call to schedule. Pt states Tues, Wed and Fri are good days to do test.

## 2015-12-12 ENCOUNTER — Ambulatory Visit (HOSPITAL_COMMUNITY)
Admission: RE | Admit: 2015-12-12 | Discharge: 2015-12-12 | Disposition: A | Discharge status: Home / Self Care | Payer: 59 | Source: Ambulatory Visit | Attending: Cardiology | Admitting: Cardiology

## 2015-12-12 DIAGNOSIS — I2510 Atherosclerotic heart disease of native coronary artery without angina pectoris: Secondary | ICD-10-CM | POA: Diagnosis not present

## 2015-12-12 DIAGNOSIS — R079 Chest pain, unspecified: Secondary | ICD-10-CM

## 2015-12-12 DIAGNOSIS — I10 Essential (primary) hypertension: Secondary | ICD-10-CM | POA: Insufficient documentation

## 2015-12-12 DIAGNOSIS — E785 Hyperlipidemia, unspecified: Secondary | ICD-10-CM | POA: Insufficient documentation

## 2015-12-12 DIAGNOSIS — I739 Peripheral vascular disease, unspecified: Secondary | ICD-10-CM | POA: Diagnosis not present

## 2015-12-12 DIAGNOSIS — Z720 Tobacco use: Secondary | ICD-10-CM | POA: Insufficient documentation

## 2015-12-18 ENCOUNTER — Encounter: Payer: Self-pay | Admitting: Physician Assistant

## 2015-12-19 ENCOUNTER — Encounter (HOSPITAL_COMMUNITY): Payer: Self-pay | Admitting: Emergency Medicine

## 2015-12-19 ENCOUNTER — Inpatient Hospital Stay (HOSPITAL_COMMUNITY)
Admission: EM | Admit: 2015-12-19 | Discharge: 2015-12-20 | DRG: 247 | Disposition: A | Discharge status: Home / Self Care | Payer: 59 | Attending: Cardiology | Admitting: Cardiology

## 2015-12-19 ENCOUNTER — Encounter (HOSPITAL_COMMUNITY)
Admission: EM | Disposition: A | Discharge status: Home / Self Care | Payer: Self-pay | Source: Home / Self Care | Attending: Cardiology

## 2015-12-19 ENCOUNTER — Emergency Department (HOSPITAL_COMMUNITY): Payer: 59

## 2015-12-19 DIAGNOSIS — Z7982 Long term (current) use of aspirin: Secondary | ICD-10-CM | POA: Diagnosis not present

## 2015-12-19 DIAGNOSIS — I25110 Atherosclerotic heart disease of native coronary artery with unstable angina pectoris: Secondary | ICD-10-CM | POA: Diagnosis present

## 2015-12-19 DIAGNOSIS — I119 Hypertensive heart disease without heart failure: Secondary | ICD-10-CM | POA: Diagnosis present

## 2015-12-19 DIAGNOSIS — Z9861 Coronary angioplasty status: Secondary | ICD-10-CM | POA: Diagnosis present

## 2015-12-19 DIAGNOSIS — I10 Essential (primary) hypertension: Secondary | ICD-10-CM | POA: Diagnosis present

## 2015-12-19 DIAGNOSIS — F17210 Nicotine dependence, cigarettes, uncomplicated: Secondary | ICD-10-CM | POA: Diagnosis present

## 2015-12-19 DIAGNOSIS — Z720 Tobacco use: Secondary | ICD-10-CM | POA: Diagnosis present

## 2015-12-19 DIAGNOSIS — Z79899 Other long term (current) drug therapy: Secondary | ICD-10-CM | POA: Diagnosis not present

## 2015-12-19 DIAGNOSIS — E785 Hyperlipidemia, unspecified: Secondary | ICD-10-CM | POA: Diagnosis present

## 2015-12-19 DIAGNOSIS — I2510 Atherosclerotic heart disease of native coronary artery without angina pectoris: Secondary | ICD-10-CM | POA: Diagnosis present

## 2015-12-19 DIAGNOSIS — I200 Unstable angina: Secondary | ICD-10-CM | POA: Diagnosis not present

## 2015-12-19 DIAGNOSIS — Z955 Presence of coronary angioplasty implant and graft: Secondary | ICD-10-CM

## 2015-12-19 DIAGNOSIS — R079 Chest pain, unspecified: Secondary | ICD-10-CM

## 2015-12-19 DIAGNOSIS — I25119 Atherosclerotic heart disease of native coronary artery with unspecified angina pectoris: Secondary | ICD-10-CM | POA: Diagnosis not present

## 2015-12-19 HISTORY — PX: CORONARY ANGIOPLASTY WITH STENT PLACEMENT: SHX49

## 2015-12-19 HISTORY — PX: CARDIAC CATHETERIZATION: SHX172

## 2015-12-19 HISTORY — DX: Hypertensive heart disease without heart failure: I11.9

## 2015-12-19 HISTORY — DX: Unspecified injury of head, initial encounter: S09.90XA

## 2015-12-19 LAB — BASIC METABOLIC PANEL
ANION GAP: 14 (ref 5–15)
BUN: 9 mg/dL (ref 6–20)
CO2: 22 mmol/L (ref 22–32)
Calcium: 8.9 mg/dL (ref 8.9–10.3)
Chloride: 101 mmol/L (ref 101–111)
Creatinine, Ser: 0.97 mg/dL (ref 0.61–1.24)
GFR calc non Af Amer: >60 mL/min (ref >60)
GLUCOSE: 120 mg/dL — AB (ref 65–99)
POTASSIUM: 3.5 mmol/L (ref 3.5–5.1)
Sodium: 137 mmol/L (ref 135–145)

## 2015-12-19 LAB — POCT ACTIVATED CLOTTING TIME: Activated Clotting Time: 290 seconds

## 2015-12-19 LAB — CBC
HEMATOCRIT: 47.9 % (ref 39.0–52.0)
HEMOGLOBIN: 16.3 g/dL (ref 13.0–17.0)
MCH: 30.2 pg (ref 26.0–34.0)
MCHC: 34 g/dL (ref 30.0–36.0)
MCV: 88.7 fL (ref 78.0–100.0)
Platelets: 244 10*3/uL (ref 150–400)
RBC: 5.4 MIL/uL (ref 4.22–5.81)
RDW: 13.7 % (ref 11.5–15.5)
WBC: 12.4 10*3/uL — ABNORMAL HIGH (ref 4.0–10.5)

## 2015-12-19 LAB — TROPONIN I: Troponin I: <0.03 ng/mL (ref <0.03)

## 2015-12-19 LAB — I-STAT TROPONIN, ED: Troponin i, poc: 0 ng/mL (ref 0.00–0.08)

## 2015-12-19 SURGERY — LEFT HEART CATH AND CORONARY ANGIOGRAPHY
Anesthesia: LOCAL

## 2015-12-19 MED ORDER — ATORVASTATIN CALCIUM 40 MG PO TABS
40.0000 mg | ORAL_TABLET | Freq: Every day | ORAL | Status: DC
  Administered 2015-12-19 – 2015-12-20 (×2): 40 mg via ORAL
  Filled 2015-12-19 (×2): qty 1

## 2015-12-19 MED ORDER — HEPARIN SODIUM (PORCINE) 1000 UNIT/ML IJ SOLN
INTRAMUSCULAR | Status: AC
  Filled 2015-12-19: qty 1

## 2015-12-19 MED ORDER — HEPARIN SODIUM (PORCINE) 1000 UNIT/ML IJ SOLN
INTRAMUSCULAR | Status: DC | PRN
  Administered 2015-12-19: 7000 [IU] via INTRAVENOUS

## 2015-12-19 MED ORDER — HEPARIN (PORCINE) IN NACL 100-0.45 UNIT/ML-% IJ SOLN
1200.0000 [IU]/h | INTRAMUSCULAR | Status: DC
  Administered 2015-12-19: 1200 [IU]/h via INTRAVENOUS
  Filled 2015-12-19: qty 250

## 2015-12-19 MED ORDER — SODIUM CHLORIDE 0.9 % WEIGHT BASED INFUSION
3.0000 mL/kg/h | INTRAVENOUS | Status: AC
  Administered 2015-12-19: 3 mL/kg/h via INTRAVENOUS

## 2015-12-19 MED ORDER — MIDAZOLAM HCL 2 MG/2ML IJ SOLN
INTRAMUSCULAR | Status: AC
  Filled 2015-12-19: qty 2

## 2015-12-19 MED ORDER — SODIUM CHLORIDE 0.9 % WEIGHT BASED INFUSION: 1.0000 mL/kg/h | INTRAVENOUS | Status: DC

## 2015-12-19 MED ORDER — LIDOCAINE HCL (PF) 1 % IJ SOLN
INTRAMUSCULAR | Status: DC | PRN
  Administered 2015-12-19: 4 mL via INTRADERMAL

## 2015-12-19 MED ORDER — ISOSORBIDE MONONITRATE ER 30 MG PO TB24
30.0000 mg | ORAL_TABLET | Freq: Every day | ORAL | Status: DC
  Administered 2015-12-19 – 2015-12-20 (×2): 30 mg via ORAL
  Filled 2015-12-19 (×2): qty 1

## 2015-12-19 MED ORDER — SODIUM CHLORIDE 0.9 % WEIGHT BASED INFUSION: 3.0000 mL/kg/h | INTRAVENOUS | Status: DC

## 2015-12-19 MED ORDER — LIDOCAINE HCL (PF) 1 % IJ SOLN
INTRAMUSCULAR | Status: AC
  Filled 2015-12-19: qty 30

## 2015-12-19 MED ORDER — FENTANYL CITRATE (PF) 100 MCG/2ML IJ SOLN
INTRAMUSCULAR | Status: AC
  Filled 2015-12-19: qty 2

## 2015-12-19 MED ORDER — NITROGLYCERIN 0.4 MG SL SUBL
0.4000 mg | SUBLINGUAL_TABLET | SUBLINGUAL | Status: DC | PRN
  Administered 2015-12-19 – 2015-12-20 (×6): 0.4 mg via SUBLINGUAL
  Filled 2015-12-19 (×3): qty 1

## 2015-12-19 MED ORDER — ASPIRIN EC 81 MG PO TBEC
81.0000 mg | DELAYED_RELEASE_TABLET | Freq: Every day | ORAL | Status: DC
  Administered 2015-12-20: 09:00:00 81 mg via ORAL
  Filled 2015-12-19: qty 1

## 2015-12-19 MED ORDER — SODIUM CHLORIDE 0.9 % IV SOLN: 250.0000 mL | INTRAVENOUS | Status: DC | PRN

## 2015-12-19 MED ORDER — IOPAMIDOL (ISOVUE-370) INJECTION 76%
INTRAVENOUS | Status: AC
  Filled 2015-12-19: qty 100

## 2015-12-19 MED ORDER — MIDAZOLAM HCL 2 MG/2ML IJ SOLN
INTRAMUSCULAR | Status: DC | PRN
  Administered 2015-12-19: 2 mg via INTRAVENOUS
  Administered 2015-12-19: 1 mg via INTRAVENOUS

## 2015-12-19 MED ORDER — NICOTINE 14 MG/24HR TD PT24
14.0000 mg | MEDICATED_PATCH | TRANSDERMAL | Status: DC
  Administered 2015-12-19: 19:00:00 14 mg via TRANSDERMAL
  Filled 2015-12-19: qty 1

## 2015-12-19 MED ORDER — HEPARIN BOLUS VIA INFUSION
4000.0000 [IU] | Freq: Once | INTRAVENOUS | Status: AC
  Administered 2015-12-19: 4000 [IU] via INTRAVENOUS
  Filled 2015-12-19: qty 4000

## 2015-12-19 MED ORDER — FAMOTIDINE 200 MG/20ML IV SOLN
20.0000 mg/h | Freq: Once | INTRAVENOUS | Status: DC
  Filled 2015-12-19: qty 4

## 2015-12-19 MED ORDER — SODIUM CHLORIDE 0.9% FLUSH: 3.0000 mL | INTRAVENOUS | Status: DC | PRN

## 2015-12-19 MED ORDER — FAMOTIDINE IN NACL 20-0.9 MG/50ML-% IV SOLN
20.0000 mg | Freq: Once | INTRAVENOUS | Status: AC
  Administered 2015-12-19: 15:00:00 20 mg via INTRAVENOUS
  Filled 2015-12-19: qty 50

## 2015-12-19 MED ORDER — TRAMADOL HCL 50 MG PO TABS
50.0000 mg | ORAL_TABLET | Freq: Once | ORAL | Status: AC
  Administered 2015-12-19: 21:00:00 50 mg via ORAL
  Filled 2015-12-19: qty 1

## 2015-12-19 MED ORDER — FENTANYL CITRATE (PF) 100 MCG/2ML IJ SOLN
INTRAMUSCULAR | Status: DC | PRN
Start: 2015-12-19 — End: 2015-12-19
  Administered 2015-12-19 (×2): 25 ug via INTRAVENOUS

## 2015-12-19 MED ORDER — NITROGLYCERIN 1 MG/10 ML FOR IR/CATH LAB
INTRA_ARTERIAL | Status: AC
  Filled 2015-12-19: qty 10

## 2015-12-19 MED ORDER — ACETAMINOPHEN 500 MG PO TABS
1000.0000 mg | ORAL_TABLET | Freq: Once | ORAL | Status: AC
  Administered 2015-12-19: 1000 mg via ORAL
  Filled 2015-12-19: qty 2

## 2015-12-19 MED ORDER — HEPARIN (PORCINE) IN NACL 2-0.9 UNIT/ML-% IJ SOLN
INTRAMUSCULAR | Status: AC
Start: 2015-12-19 — End: 2015-12-19
  Filled 2015-12-19: qty 1000

## 2015-12-19 MED ORDER — NITROGLYCERIN 1 MG/10 ML FOR IR/CATH LAB
INTRA_ARTERIAL | Status: DC | PRN
  Administered 2015-12-19: 200 ug via INTRACORONARY
  Administered 2015-12-19: 150 ug via INTRACORONARY
  Administered 2015-12-19: 200 ug via INTRACORONARY

## 2015-12-19 MED ORDER — ONDANSETRON HCL 4 MG/2ML IJ SOLN
4.0000 mg | Freq: Four times a day (QID) | INTRAMUSCULAR | Status: DC | PRN
  Filled 2015-12-19: qty 2

## 2015-12-19 MED ORDER — LISINOPRIL 10 MG PO TABS
20.0000 mg | ORAL_TABLET | Freq: Every day | ORAL | Status: DC
  Administered 2015-12-19 – 2015-12-20 (×2): 20 mg via ORAL
  Filled 2015-12-19 (×2): qty 2

## 2015-12-19 MED ORDER — CLOPIDOGREL BISULFATE 75 MG PO TABS
600.0000 mg | ORAL_TABLET | Freq: Once | ORAL | Status: AC
  Administered 2015-12-19: 600 mg via ORAL
  Filled 2015-12-19: qty 8

## 2015-12-19 MED ORDER — ANGIOPLASTY BOOK
Freq: Once | Status: AC
  Administered 2015-12-19: 15:00:00
  Filled 2015-12-19: qty 1

## 2015-12-19 MED ORDER — ASPIRIN 81 MG PO CHEW
324.0000 mg | CHEWABLE_TABLET | Freq: Once | ORAL | Status: AC
  Administered 2015-12-19: 324 mg via ORAL
  Filled 2015-12-19: qty 4

## 2015-12-19 MED ORDER — HEPARIN (PORCINE) IN NACL 2-0.9 UNIT/ML-% IJ SOLN
INTRAMUSCULAR | Status: DC | PRN
  Administered 2015-12-19: 10 mL via INTRA_ARTERIAL

## 2015-12-19 MED ORDER — HEPARIN (PORCINE) IN NACL 2-0.9 UNIT/ML-% IJ SOLN
INTRAMUSCULAR | Status: DC | PRN
  Administered 2015-12-19: 1000 mL

## 2015-12-19 MED ORDER — ACETAMINOPHEN 325 MG PO TABS
650.0000 mg | ORAL_TABLET | ORAL | Status: DC | PRN
  Administered 2015-12-19: 650 mg via ORAL
  Filled 2015-12-19: qty 2

## 2015-12-19 MED ORDER — IOPAMIDOL (ISOVUE-370) INJECTION 76%
INTRAVENOUS | Status: DC | PRN
  Administered 2015-12-19: 75 mL via INTRA_ARTERIAL

## 2015-12-19 MED ORDER — SODIUM CHLORIDE 0.9% FLUSH: 3.0000 mL | Freq: Two times a day (BID) | INTRAVENOUS | Status: DC

## 2015-12-19 MED ORDER — SODIUM CHLORIDE 0.9% FLUSH
3.0000 mL | Freq: Two times a day (BID) | INTRAVENOUS | Status: DC
  Administered 2015-12-19: 19:00:00 3 mL via INTRAVENOUS

## 2015-12-19 MED ORDER — VERAPAMIL HCL 2.5 MG/ML IV SOLN
INTRAVENOUS | Status: AC
  Filled 2015-12-19: qty 2

## 2015-12-19 MED ORDER — CLOPIDOGREL BISULFATE 75 MG PO TABS
75.0000 mg | ORAL_TABLET | Freq: Every day | ORAL | Status: DC
  Administered 2015-12-20: 09:00:00 75 mg via ORAL
  Filled 2015-12-19: qty 1

## 2015-12-19 SURGICAL SUPPLY — 17 items
BALLN ~~LOC~~ EMERGE MR 2.5X12 (BALLOONS) ×2
BALLOON ~~LOC~~ EMERGE MR 2.5X12 (BALLOONS) ×1 IMPLANT
CATH INFINITI 5 FR JL3.5 (CATHETERS) ×2 IMPLANT
CATH INFINITI JR4 5F (CATHETERS) ×2 IMPLANT
CATH VISTA GUIDE 6FR JR4 (CATHETERS) ×2 IMPLANT
DEVICE RAD COMP TR BAND LRG (VASCULAR PRODUCTS) ×2 IMPLANT
GLIDESHEATH SLEND SS 6F .021 (SHEATH) ×2 IMPLANT
KIT ENCORE 26 ADVANTAGE (KITS) ×2 IMPLANT
KIT HEART LEFT (KITS) ×2 IMPLANT
PACK CARDIAC CATHETERIZATION (CUSTOM PROCEDURE TRAY) ×2 IMPLANT
STENT PROMUS PREM MR 2.25X20 (Permanent Stent) ×2 IMPLANT
SYR MEDRAD MARK V 150ML (SYRINGE) ×2 IMPLANT
TRANSDUCER W/STOPCOCK (MISCELLANEOUS) ×2 IMPLANT
TUBING CIL FLEX 10 FLL-RA (TUBING) ×2 IMPLANT
WIRE COUGAR XT STRL 190CM (WIRE) ×2 IMPLANT
WIRE EMERALD 3MM-J .035X260CM (WIRE) ×2 IMPLANT
WIRE HI TORQ VERSACORE-J 145CM (WIRE) ×2 IMPLANT

## 2015-12-19 NOTE — Progress Notes (Signed)
Pt c/o sharp chest pain 6/10 @ 22:15. V/S stable. EKG done. Ntg SLx1 dose given as PRN  with relief. Dr. Donnie Ahoilley informed.  Will continue to monitor pt.
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
## 2015-12-19 NOTE — ED Notes (Signed)
Herbie BaltimoreHarding, MD contacted Shirlee LatchMcLean, MD re: pt reporting feeling a fluttering sensation in his chest

## 2015-12-19 NOTE — ED Notes (Addendum)
Pt reports that chest pain woke him up, he took a nitro which relieved the pain.  He now has a headache. He is scheduled for a stress test on Friday, extensive family hx of heart issues.

## 2015-12-19 NOTE — Progress Notes (Signed)
Dr. Donnie Ahoilley paged r/t patient episode of chest pain, mid scapular radiates to left jaw and mid sternal; described as sharp sudden onset, non reproducible to palpation; c/o shortness of breath. Denies nausea. Is not diaphoretic. EKG done, NTG SL x 2 given, 02 @2L  started. S/p 2nd sl NTG pt reports pain 0/10. C/o headache not resolved with tylenol. Will continue to monitor patient for chest discomfort, and will follow orders for pain r/t headache.

## 2015-12-19 NOTE — Interval H&P Note (Signed)
History and Physical Interval Note:  12/19/2015 3:36 PM  Wesley Newman  has presented today for surgery, with the diagnosis of cp  The various methods of treatmeCath Lab Visit (complete for each Cath Lab visit)  Clinical Evaluation Leading to the Procedure:   ACS: Yes.    Non-ACS:    Anginal Classification: CCS II  Anti-ischemic medical therapy: Minimal Therapy (1 class of medications)  Non-Invasive Test Results: No non-invasive testing performed  Prior CABG: No previous CABG      nt have been discussed with the patient and family. After consideration of risks, benefits and other options for treatment, the patient has consented to  Procedure(s): Left Heart Cath and Coronary Angiography (N/A) as a surgical intervention .  The patient's history has been reviewed, patient examined, no change in status, stable for surgery.  I have reviewed the patient's chart and labs.  Questions were answered to the patient's satisfaction.     Tonny Bollmanooper, Nicolo Tomko

## 2015-12-19 NOTE — ED Triage Notes (Signed)
Pt. reports left chest pain radiating to neck and left arm onset 12 midnight with mild SOB and emesis , pt. took 1 NTG sl with relief , his cardiologist is Dr. Lucien MonsMClain .
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
## 2015-12-20 ENCOUNTER — Encounter (HOSPITAL_COMMUNITY): Payer: Self-pay | Admitting: Cardiovascular Disease

## 2015-12-20 ENCOUNTER — Inpatient Hospital Stay (HOSPITAL_COMMUNITY): Payer: 59

## 2015-12-20 ENCOUNTER — Telehealth: Payer: Self-pay | Admitting: Cardiology

## 2015-12-20 DIAGNOSIS — I25119 Atherosclerotic heart disease of native coronary artery with unspecified angina pectoris: Secondary | ICD-10-CM

## 2015-12-20 DIAGNOSIS — I10 Essential (primary) hypertension: Secondary | ICD-10-CM | POA: Diagnosis present

## 2015-12-20 DIAGNOSIS — I119 Hypertensive heart disease without heart failure: Secondary | ICD-10-CM | POA: Diagnosis present

## 2015-12-20 DIAGNOSIS — Z720 Tobacco use: Secondary | ICD-10-CM | POA: Diagnosis present

## 2015-12-20 LAB — CBC
HEMATOCRIT: 45.2 % (ref 39.0–52.0)
Hemoglobin: 15.2 g/dL (ref 13.0–17.0)
MCH: 30.3 pg (ref 26.0–34.0)
MCHC: 33.6 g/dL (ref 30.0–36.0)
MCV: 90 fL (ref 78.0–100.0)
PLATELETS: 189 10*3/uL (ref 150–400)
RBC: 5.02 MIL/uL (ref 4.22–5.81)
RDW: 13.7 % (ref 11.5–15.5)
WBC: 8.5 10*3/uL (ref 4.0–10.5)

## 2015-12-20 LAB — BASIC METABOLIC PANEL
Anion gap: 9 (ref 5–15)
BUN: 8 mg/dL (ref 6–20)
CHLORIDE: 108 mmol/L (ref 101–111)
CO2: 21 mmol/L — AB (ref 22–32)
CREATININE: 1.09 mg/dL (ref 0.61–1.24)
Calcium: 8.7 mg/dL — ABNORMAL LOW (ref 8.9–10.3)
GFR calc Af Amer: >60 mL/min (ref >60)
GFR calc non Af Amer: >60 mL/min (ref >60)
Glucose, Bld: 97 mg/dL (ref 65–99)
POTASSIUM: 4 mmol/L (ref 3.5–5.1)
Sodium: 138 mmol/L (ref 135–145)

## 2015-12-20 LAB — TROPONIN I
TROPONIN I: 0.05 ng/mL — AB (ref <0.03)
Troponin I: 0.07 ng/mL (ref <0.03)

## 2015-12-20 MED ORDER — MORPHINE SULFATE (PF) 4 MG/ML IV SOLN: 4.0000 mg | Freq: Once | INTRAVENOUS | Status: AC

## 2015-12-20 MED ORDER — NITROGLYCERIN IN D5W 200-5 MCG/ML-% IV SOLN
0.0000 ug/min | INTRAVENOUS | Status: DC
  Administered 2015-12-20: 05:00:00 20 ug/min via INTRAVENOUS

## 2015-12-20 MED ORDER — CLOPIDOGREL BISULFATE 75 MG PO TABS: 75.0000 mg | ORAL_TABLET | Freq: Every day | ORAL | 6 refills | Status: DC

## 2015-12-20 MED ORDER — HYDROMORPHONE HCL 1 MG/ML IJ SOLN
1.0000 mg | Freq: Once | INTRAMUSCULAR | Status: AC
  Administered 2015-12-20: 06:00:00 1 mg via INTRAVENOUS
  Filled 2015-12-20: qty 1

## 2015-12-20 MED ORDER — HYDRALAZINE HCL 20 MG/ML IJ SOLN
10.0000 mg | Freq: Once | INTRAMUSCULAR | Status: AC
  Administered 2015-12-20: 04:00:00 10 mg via INTRAVENOUS
  Filled 2015-12-20: qty 1

## 2015-12-20 MED ORDER — MORPHINE SULFATE (PF) 2 MG/ML IV SOLN
2.0000 mg | INTRAVENOUS | Status: DC | PRN
  Filled 2015-12-20: qty 1

## 2015-12-20 MED ORDER — SODIUM CHLORIDE 0.9 % IV SOLN
INTRAVENOUS | Status: DC
  Administered 2015-12-20: 05:00:00 via INTRAVENOUS

## 2015-12-20 MED ORDER — NITROGLYCERIN 0.4 MG SL SUBL: 0.4000 mg | SUBLINGUAL_TABLET | SUBLINGUAL | 3 refills | Status: DC | PRN

## 2015-12-20 MED ORDER — MORPHINE SULFATE (PF) 2 MG/ML IV SOLN
INTRAVENOUS | Status: AC
  Administered 2015-12-20: 4 mg via INTRAVENOUS
  Filled 2015-12-20: qty 1

## 2015-12-20 MED ORDER — MORPHINE SULFATE (PF) 2 MG/ML IV SOLN
INTRAVENOUS | Status: AC
  Filled 2015-12-20: qty 1

## 2015-12-20 MED ORDER — FUROSEMIDE 10 MG/ML IJ SOLN
20.0000 mg | Freq: Once | INTRAMUSCULAR | Status: AC
  Administered 2015-12-20: 07:00:00 20 mg via INTRAVENOUS
  Filled 2015-12-20: qty 2

## 2015-12-20 MED ORDER — NITROGLYCERIN IN D5W 200-5 MCG/ML-% IV SOLN
INTRAVENOUS | Status: AC
  Filled 2015-12-20: qty 250

## 2015-12-20 MED ORDER — HEPARIN (PORCINE) IN NACL 100-0.45 UNIT/ML-% IJ SOLN: 1200.0000 [IU]/h | INTRAMUSCULAR | Status: DC
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
## 2015-12-20 NOTE — Telephone Encounter (Signed)
PT'S   WIFE NEEDS TO GO BACK TO WORK BUT  PT  IS  UNABLE TO LIFT  FOR ANOTHER  5 DAYS   PT  HAS   A  4210  MONTH OLD BABY   THAT  WEIGHS 20 LB   PT'S  WIFE WILL DROP OFF FMLA FORMS TOMORROW  TO  BE FILLED   OUT .Wesley Newman/CY

## 2015-12-20 NOTE — Discharge Summary (Signed)
Discharge Summary    Patient ID: Wesley Newman,  MRN: [id number], DOB/AGE: [birth year] 36 y.o.  Admit date: 12/19/2015 Discharge date: 12/20/2015  Primary Care Provider: No PCP Per Patient Primary Cardiologist: Golden Circle. McLean, MD   Discharge Diagnoses    Principal Problem:   Unstable angina (HCC)  **s/p PCI/DES to the RPDA this admission.  Active Problems:   Coronary artery disease involving native coronary artery with angina pectoris (HCC)   Hypertensive heart disease   Tobacco abuse   Hyperlipidemia  Allergies No Known Allergies  Diagnostic Studies/Procedures    Cardiac Catheterization and Percutaneous Coronary Intervention 9.6.2017  Coronary Findings  Dominance: Right  Left Anterior Descending  Mid LAD lesion, 40% stenosed.  First Diagonal Branch  Vessel is small in size.  Ost 1st Diag to 1st Diag lesion, 60% stenosed. there is diffuse 50-60% stenosis throughout a small caliber but large territory diagonal branch  Left Circumflex  Prox Cx to Mid Cx lesion, 40% stenosed. diffuse mid-circumflex stenosis  Right Coronary Artery  Right Posterior Descending Artery  RPDA lesion, 80% stenosed.  Angioplasty: The RPDA was successfully stented using a 2.25 x 20 mm Promus Premier DES.  There is no residual stenosis post intervention.  _____________   History of Present Illness     35 y/o ? with a h/o HTN (dx age 35), HL, tob abuse and FH of premature CAD.  He underwent prior cath in 2013 revealing nonobs dzs, followed by a low risk MV in 2014.  He was recently seen by Dr. Shirlee LatchMcLean with complaints of c/p and underwent cath revealing moderate, up to 75% RPDA dzs.  The area was not felt to be amenable to a flow wire measurement and thus he was placed on long acting nitrate therapy with a plan for outpt MV to determine the ischemic significance of the PDA lesion.  He did reasonably well on imdur but continued to note dyspnea on exertion and poor exercise tolerance. In the early morning  hours of 9/6, he was awakened by substernal chest pain radiating to his jaw and left arm. This was associated with nausea and vomiting x 2.  After three hours of persistent discomfort, he took a ntg with eventual relief.  He then presented to the [facility] ED where ECG was non-acute and troponin was normal.  He was admitted for further evaluation.  Hospital Course     Consultants: None   Given known prior RPDA disease, decision was made to pursue diagnostic catheterization with plan for PCI.  This was performed on 9/6, and a 2.25 x 20 mm Promus Premier DES was successfully placed in the RPDA.  He tolerated the procedure well but then developed recurrent chest pain on the evening of 9/6, which was nitrate responsive.  He continued to have chest pain and he was placed on heparin and IV ntg.  Troponin was reevaluated and returned mildly elevated at 0.05  0.07, which was felt to be secondary to PCI as opposed to new ACS.  CXR this AM did show mild vascular congestion and he was treated with one dose of IV lasix.  Following this, he has been seen by Dr. Excell Seltzerooper and cardiac rehab and has been ambulating without recurrent discomfort.  He will be discharged home today in good condition.  _____________  Discharge Vitals Blood pressure (!) 122/50, pulse 89, temperature 97.5 F (36.4 C), temperature source Oral, resp. rate 16, height 5\' 11"  (1.803 m), weight 189 lb 9.5 oz (86 kg), SpO2  97 %.  Filed Weights   12/19/15 0449 12/19/15 0458 12/20/15 0124  Weight: 191 lb (86.6 kg) 191 lb (86.6 kg) 189 lb 9.5 oz (86 kg)    Labs & Radiologic Studies    CBC  Recent Labs  12/19/15 0526 12/20/15 0202  WBC 12.4* 8.5  HGB 16.3 15.2  HCT 47.9 45.2  MCV 88.7 90.0  PLT 244 189   Basic Metabolic Panel  Recent Labs  12/19/15 0526 12/20/15 0202  NA 137 138  K 3.5 4.0  CL 101 108  CO2 22 21*  GLUCOSE 120* 97  BUN 9 8  CREATININE 0.97 1.09  CALCIUM 8.9 8.7*   Cardiac Enzymes  Recent Labs  12/19/15 2001  12/20/15 0202 12/20/15 0522  TROPONINI <0.03 0.05* 0.07*  _____________  Dg Chest 2 View  Result Date: 12/19/2015 CLINICAL DATA:  Acute onset of left-sided chest pain, radiating down the left arm. Initial encounter. EXAM: CHEST  2 VIEW COMPARISON:  None. FINDINGS: The lungs are well-aerated and clear. There is no evidence of focal opacification, pleural effusion or pneumothorax. The heart is normal in size; the mediastinal contour is within normal limits. No acute osseous abnormalities are seen. IMPRESSION: No acute cardiopulmonary process seen. Electronically Signed   By: Roanna Raider M.D.   On: 12/19/2015 05:39   Dg Chest Port 1 View  Result Date: 12/20/2015 CLINICAL DATA:  Acute onset of mid back pain. Status post cardiac stent placement. Initial encounter. EXAM: PORTABLE CHEST 1 VIEW COMPARISON:  Chest radiograph performed 12/19/2015 FINDINGS: The lungs are well-aerated. Mild vascular congestion is noted. There is no evidence of focal opacification, pleural effusion or pneumothorax. The cardiomediastinal silhouette is borderline normal in size. No acute osseous abnormalities are seen. IMPRESSION: Mild vascular congestion noted.  Lungs remain grossly clear. Electronically Signed   By: Roanna Raider M.D.   On: 12/20/2015 06:00   Disposition   Pt is being discharged home today in good condition.  Follow-up Plans & Appointments    Follow-up Information    Tereso Newcomer, PA-C Follow up on 01/02/2016.   Specialties:  Cardiology, Physician Assistant Why:  10:15 AM - Dr. Alford Highland PA Contact information: [address] [place] [address] [phone]           Discharge Medications   Current Discharge Medication List    START taking these medications   Details  clopidogrel (PLAVIX) 75 MG tablet Take 1 tablet (75 mg total) by mouth daily with breakfast. Qty: 30 tablet, Refills: 6    nitroGLYCERIN (NITROSTAT) 0.4 MG SL tablet Place 1 tablet (0.4 mg total) under  the tongue every 5 (five) minutes x 3 doses as needed for chest pain. Qty: 25 tablet, Refills: 3      CONTINUE these medications which have NOT CHANGED   Details  aspirin EC 81 MG tablet Take 1 tablet (81 mg total) by mouth daily.    atorvastatin (LIPITOR) 40 MG tablet Take 1 tablet (40 mg total) by mouth daily. One tablet mouth daily Qty: 30 tablet, Refills: 0    isosorbide mononitrate (IMDUR) 30 MG 24 hr tablet Take 30 mg by mouth daily.    lisinopril (PRINIVIL,ZESTRIL) 20 MG tablet Take 1 tablet (20 mg total) by mouth daily. Qty: 30 tablet, Refills: 0    buPROPion (WELLBUTRIN SR) 150 MG 12 hr tablet Take 1 tablet by mouth daily for 3 days, then take 1 tablet by mouth two times a day Qty: 60 tablet, Refills: 2  Associated Diagnoses: Chest pain, unspecified chest pain type; Claudication (HCC)        Outstanding Labs/Studies   None  Duration of Discharge Encounter   Greater than 30 minutes including physician time.  Signed, Nicolasa Ducking NP 12/20/2015, 9:03 AM  Agree with above.   Kymir Coles,MD 1:38 AM

## 2015-12-20 NOTE — Progress Notes (Deleted)
CARDIOLOGY PROGRESS NOTE Subjective:   Underwent PCI of PDA earlier today. Has been having CP all night. CP improved with NTG then comes back.  SBP elevated. Post PCI troponin 0.05 (up from < 0.03). Wife requesting CXR to further evaluate. "we want something done."    Intake/Output Summary (Last 24 hours) at 12/20/15 0553 Last data filed at 12/20/15 0300  Gross per 24 hour  Intake            989.7 ml  Output              850 ml  Net            139.7 ml    Current meds: . aspirin EC  81 mg Oral Daily  . atorvastatin  40 mg Oral Daily  . clopidogrel  75 mg Oral Q breakfast  . isosorbide mononitrate  30 mg Oral Daily  . lisinopril  20 mg Oral Daily  . nicotine  14 mg Transdermal Q24H  . sodium chloride flush  3 mL Intravenous Q12H   Infusions: . sodium chloride 10 mL/hr at 12/20/15 0500  . heparin    . nitroGLYCERIN 20 mcg/min (12/20/15 0453)     Objective:  Blood pressure (!) 160/90, pulse (!) 59, temperature 97.5 F (36.4 C), temperature source Axillary, resp. rate 14, height 5\' 11"  (1.803 m), weight 86 kg (189 lb 9.5 oz), SpO2 99 %. Weight change: -0.637 kg (-1 lb 6.5 oz)  Physical Exam: General:  Lying in bed NAD HEENT: normal Neck: supple. JVP flat .  Cor: PMI nondisplaced. Regular rate & rhythm. No rubs, gallops or murmurs. Lungs: clear anteriorly Abdomen: soft, nontender, nondistended.  Extremities: Warm no cyanosis, clubbing, rash, edema Neuro: alert & orientedx3, cranial nerves grossly intact. moves all 4 extremities w/o difficulty.  Telemetry: NSR 80s  Lab Results: Basic Metabolic Panel:  Recent Labs Lab 12/19/15 0526 12/20/15 0202  NA 137 138  K 3.5 4.0  CL 101 108  CO2 22 21*  GLUCOSE 120* 97  BUN 9 8  CREATININE 0.97 1.09  CALCIUM 8.9 8.7*   Liver Function Tests: No results for input(s): AST, ALT, ALKPHOS, BILITOT, PROT, ALBUMIN in the last 168 hours. No results for input(s): LIPASE, AMYLASE in the last 168 hours. No results for  input(s): AMMONIA in the last 168 hours. CBC:  Recent Labs Lab 12/19/15 0526 12/20/15 0202  WBC 12.4* 8.5  HGB 16.3 15.2  HCT 47.9 45.2  MCV 88.7 90.0  PLT 244 189   Cardiac Enzymes:  Recent Labs Lab 12/19/15 0730 12/19/15 1455 12/19/15 2001 12/20/15 0202  TROPONINI <0.03 <0.03 <0.03 0.05*   BNP: Invalid input(s): POCBNP CBG: No results for input(s): GLUCAP in the last 168 hours. Microbiology: No results found for: CULT No results for input(s): CULT, SDES in the last 168 hours.  Imaging: Dg Chest 2 View  Result Date: 12/19/2015 CLINICAL DATA:  Acute onset of left-sided chest pain, radiating down the left arm. Initial encounter. EXAM: CHEST  2 VIEW COMPARISON:  None. FINDINGS: The lungs are well-aerated and clear. There is no evidence of focal opacification, pleural effusion or pneumothorax. The heart is normal in size; the mediastinal contour is within normal limits. No acute osseous abnormalities are seen. IMPRESSION: No acute cardiopulmonary process seen. Electronically Signed   By: Roanna Raider M.D.   On: 12/19/2015 05:39    ASSESSMENT:  1. Post-PCI chest pain 2. CAD, premature with stenting of PDA today 3. Hypertension.   PLAN/DISCUSSION:  Patient  with post-PCI CP. I have reviewed cath films personally and no evidence of dissection or other complications noted.   Serial ECGs with no objective signs of ischemia. Will repeat STAT troponin, if on the rise will return to cath lab for relook cath.  Start heparin and IV NTG. Await troponin.  Will do CXR at wife's request.    LOS: 1 day    Arvilla MeresBensimhon, Amaka Gluth, MD 12/20/2015, 5:53 AM

## 2015-12-20 NOTE — Progress Notes (Addendum)
CARDIOLOGY PROGRESS NOTE Subjective:   Underwent PCI of PDA earlier today. Has been having CP all night. CP improved with NTG then comes back.  SBP elevated. Post PCI troponin 0.05 (up from < 0.03). Wife requesting CXR to further evaluate. "we want something done."    Intake/Output Summary (Last 24 hours) at 12/20/15 0516 Last data filed at 12/20/15 0300  Gross per 24 hour  Intake            989.7 ml  Output              850 ml  Net            139.7 ml    Current meds: . aspirin EC  81 mg Oral Daily  . atorvastatin  40 mg Oral Daily  . clopidogrel  75 mg Oral Q breakfast  . isosorbide mononitrate  30 mg Oral Daily  . lisinopril  20 mg Oral Daily  . nicotine  14 mg Transdermal Q24H  . sodium chloride flush  3 mL Intravenous Q12H   Infusions: . sodium chloride 10 mL/hr at 12/20/15 0500  . nitroGLYCERIN 20 mcg/min (12/20/15 0453)     Objective:  Blood pressure (!) 160/90, pulse (!) 59, temperature 97.5 F (36.4 C), temperature source Axillary, resp. rate 14, height 5\' 11"  (1.803 m), weight 86 kg (189 lb 9.5 oz), SpO2 99 %. Weight change: -0.637 kg (-1 lb 6.5 oz)  Physical Exam: General:  Lying in bed NAD HEENT: normal Neck: supple. JVP flat .  Cor: PMI nondisplaced. Regular rate & rhythm. No rubs, gallops or murmurs. Lungs: clear anteriorly Abdomen: soft, nontender, nondistended.  Extremities: Warm no cyanosis, clubbing, rash, edema Neuro: alert & orientedx3, cranial nerves grossly intact. moves all 4 extremities w/o difficulty.  Telemetry: NSR 80s  Lab Results: Basic Metabolic Panel:  Recent Labs Lab 12/19/15 0526 12/20/15 0202  NA 137 138  K 3.5 4.0  CL 101 108  CO2 22 21*  GLUCOSE 120* 97  BUN 9 8  CREATININE 0.97 1.09  CALCIUM 8.9 8.7*   Liver Function Tests: No results for input(s): AST, ALT, ALKPHOS, BILITOT, PROT, ALBUMIN in the last 168 hours. No results for input(s): LIPASE, AMYLASE in the last 168 hours. No results for input(s): AMMONIA in  the last 168 hours. CBC:  Recent Labs Lab 12/19/15 0526 12/20/15 0202  WBC 12.4* 8.5  HGB 16.3 15.2  HCT 47.9 45.2  MCV 88.7 90.0  PLT 244 189   Cardiac Enzymes:  Recent Labs Lab 12/19/15 0730 12/19/15 1455 12/19/15 2001 12/20/15 0202  TROPONINI <0.03 <0.03 <0.03 0.05*   BNP: Invalid input(s): POCBNP CBG: No results for input(s): GLUCAP in the last 168 hours. Microbiology: No results found for: CULT No results for input(s): CULT, SDES in the last 168 hours.  Imaging: Dg Chest 2 View  Result Date: 12/19/2015 CLINICAL DATA:  Acute onset of left-sided chest pain, radiating down the left arm. Initial encounter. EXAM: CHEST  2 VIEW COMPARISON:  None. FINDINGS: The lungs are well-aerated and clear. There is no evidence of focal opacification, pleural effusion or pneumothorax. The heart is normal in size; the mediastinal contour is within normal limits. No acute osseous abnormalities are seen. IMPRESSION: No acute cardiopulmonary process seen. Electronically Signed   By: Roanna Raider M.D.   On: 12/19/2015 05:39    ASSESSMENT:  1. Post-PCI chest pain 2. CAD, premature with stenting of PDA today 3. Hypertension.   PLAN/DISCUSSION:  Patient with post-PCI CP. I have  reviewed cath films personally and no evidence of dissection or other complications noted.   Serial ECGs with no objective signs of ischemia. Will repeat STAT troponin, if on the rise will return to cath lab for relook cath.  Start heparin and IV NTG. Await troponin.  Will do CXR at wife's request.    LOS: 1 day    Arvilla MeresBensimhon, Roselin Wiemann, MD 12/20/2015, 5:16 AM   Addendum:  F/u troponin 0.07. (Unchanged)  CXR with ? Mild edema. Will give one dose IV lasix. Wil d/w Dr. Shirlee LatchMclean and interventional team.   Ramsay Bognar,MD 6:01 AM

## 2015-12-20 NOTE — Progress Notes (Signed)
TR BAND REMOVAL  LOCATION:    right radial  DEFLATED PER PROTOCOL:    Yes.    TIME BAND OFF / DRESSING APPLIED:    2100    SITE UPON ARRIVAL:    Level 0  SITE AFTER BAND REMOVAL:    Level 0  CIRCULATION SENSATION AND MOVEMENT:    Within Normal Limits   Yes.    COMMENTS:   Pt tolerated deflation well; radial site teaching done. Pt verbalizes and demonstrates 75% of the time understanding.

## 2015-12-20 NOTE — Progress Notes (Signed)
Dr. Gala RomneyBensimhon paged r/t episodes of chest pain (3rd episode since 1900)(cp protocol followed as per previous). Relieved by NTG, and accompanied by HTN and anxiety by patient and spouse, whom states she is a cardiac stepdown nurse. Patient and family reassured about treatment plan at this point; even after explaining morphine related to the cardiac oxygenation. 2 RNs at bedside to provide reassurance.

## 2015-12-20 NOTE — Progress Notes (Signed)
CARDIAC REHAB PHASE I   PRE:  Rate/Rhythm: 103 ST    BP: sitting 125/90    SaO2: 99 RA  MODE:  Ambulation: 450 ft   POST:  Rate/Rhythm: 105 ST    BP: sitting 147/99     SaO2:    Earlier pt c/o 7/10 back pain, between shoulder blades, "like a knife". To BR to vomit upon sitting up. I returned and he was no longer having this back pain. Felt good walking, only c/o was a HA. Ed completed with pt and wife (although she slept off and on). Good reception from pt. He is now motivated to quit smoking and resources given including fake cigarette. Discussed diet (including reducing sugars) and ex. Understands NTG and Plavix and he is interested in CRPII. Will send referral to [facility].  [phone]  Harriet MassonRandi Kristan Shawntel Farnworth CES, ACSM 12/20/2015 10:17 AM

## 2015-12-20 NOTE — Progress Notes (Signed)
Pt c/o 10/10 chest pain; unrelieved by NTG x 2; after 3rd NTG pain decreased to 2/10.  Dr. Gala RomneyBensimhon paged.

## 2015-12-20 NOTE — Telephone Encounter (Signed)
New message    Pts wife is calling about getting FMLA papers filled out and her husband not being able to lift over 5lbs. She stated that she will have to go to work and so the pt will have to take care of the children that are over 5lb. Please call.

## 2015-12-20 NOTE — Care Management Note (Signed)
Case Management Note  Patient Details  Name: Wesley Newman MRN: [id number] Date of Birth: Dec 21, 1980  Subjective/Objective:     Patient is s/p coronary stent intervention,Patient is indep pta , lives with spouse, on plavix, has medication coverage and transport at dc, he states he does not have a pcp, but wife is RN she has [employer] phone number to call to get ast with in network PCP.  Patient is for dc today.                 Action/Plan:   Expected Discharge Date:                  Expected Discharge Plan:  Home/Self Care  In-House Referral:     Discharge planning Services  CM Consult  Post Acute Care Choice:    Choice offered to:     DME Arranged:    DME Agency:     HH Arranged:    HH Agency:     Status of Service:  Completed, signed off  If discussed at [employer] Length of Stay Meetings, dates discussed:    Additional Comments:  Leone Havenaylor, Elbridge Magowan Clinton, RN 12/20/2015, 9:25 AM

## 2015-12-20 NOTE — Progress Notes (Signed)
CRITICAL VALUE ALERT  Critical value received:  Troponin- critical high of 0.05   Date of notification:  12/20/2015  Time of notification:  0245  Critical value read back:Yes.    Nurse who received alert:  Eunice Blaseracy Rhythm Gubbels, RN   MD notified (1st page):  Dr. Gala RomneyBensimhon   Time of first page:  0255   MD notified (2nd page): n/a   Time of second page: n/a   Responding MD:  Dr. Gala RomneyBensimhon   Time MD responded:  [phone]

## 2015-12-20 NOTE — Discharge Instructions (Signed)
**  PLEASE REMEMBER TO BRING ALL OF YOUR MEDICATIONS TO EACH OF YOUR FOLLOW-UP OFFICE VISITS. ° °NO HEAVY LIFTING OR SEXUAL ACTIVITY X 7 DAYS. °NO DRIVING X 3-5 DAYS. °NO SOAKING BATHS, HOT TUBS, POOLS, ETC., X 7 DAYS. ° °Radial Site Care °Refer to this sheet in the next few weeks. These instructions provide you with information on caring for yourself after your procedure. Your caregiver may also give you more specific instructions. Your treatment has been planned according to current medical practices, but problems sometimes occur. Call your caregiver if you have any problems or questions after your procedure. °HOME CARE INSTRUCTIONS °You may shower the day after the procedure. Remove the bandage (dressing) and gently wash the site with plain soap and water. Gently pat the site dry.  °Do not apply powder or lotion to the site.  °Do not submerge the affected site in water for 3 to 5 days.  °Inspect the site at least twice daily.  °Do not flex or bend the affected arm for 24 hours.  °No lifting over 5 pounds (2.3 kg) for 5 days after your procedure.  °Do not drive home if you are discharged the same day of the procedure. Have someone else drive you.  ° °What to expect: °Any bruising will usually fade within 1 to 2 weeks.  °Blood that collects in the tissue (hematoma) may be painful to the touch. It should usually decrease in size and tenderness within 1 to 2 weeks.  °SEEK IMMEDIATE MEDICAL CARE IF: °You have unusual pain at the radial site.  °You have redness, warmth, swelling, or pain at the radial site.  °You have drainage (other than a small amount of blood on the dressing).  °You have chills.  °You have a fever or persistent symptoms for more than 72 hours.  °You have a fever and your symptoms suddenly get worse.  °Your arm becomes pale, cool, tingly, or numb.  °You have heavy bleeding from the site. Hold pressure on the site.  °_____________  ° °  ° °10 Habits of Highly Healthy People ° °[facility] wants to help  you get well and stay well.  Live a longer, healthier life by practicing healthy habits every day. ° °1.  Visit your primary care provider regularly. °2.  Make time for family and friends.  Healthy relationships are important. °3.  Take medications as directed by your provider. °4.  Maintain a healthy weight and a trim waistline. °5.  Eat healthy meals and snacks, rich in fruits, vegetables, whole grains, and lean proteins. °6.  Get moving every day - aim for 150 minutes of moderate physical activity each week. °7.  Don't smoke. °8.  Avoid alcohol or drink in moderation. °9.  Manage stress through meditation or mindful relaxation. °10.  Get seven to nine hours of quality sleep each night. ° °Want more information on healthy habits?  To learn more about these and other healthy habits, visit [email or web address]. °_____________ °   °

## 2015-12-20 NOTE — Progress Notes (Signed)
Pt calls out to RN he has chest pain; unchanged from previous episodes. Stated at this time the plan of care is control of current hypertension. Will continue to monitor for changes in chest pain. Pt verbalizes understanding of why this is now the plan of care.

## 2015-12-20 NOTE — Progress Notes (Signed)
Pt evaluated. Wife at bedside. Exam benign. Troponin 0.05, 0.07. Pain resolved. EKG normal.  Suspect intimal stretch was cause of chest discomfort overnight. He has not had a post-procedural infarct.   Plan: stop IV NTG. Ambulate with cardiac rehab. DC home later today if symptoms resolved. Close outpatient FU. Needs to stop smoking - counseling done. Importance of medication adherence reviewed.   Tonny BollmanCooper, Analicia Skibinski 12/20/2015 8:45 AM

## 2015-12-24 NOTE — Telephone Encounter (Signed)
Do not see DPR on file for pt's wife, LMTCB for pt.

## 2015-12-24 NOTE — Telephone Encounter (Signed)
Follow up    Pt wife verbalized that she is calling to speak to rn to see if the FMLA forms was received and faxed back to appropriate destination

## 2015-12-25 NOTE — Telephone Encounter (Signed)
I have not received FMLA forms for Dr Shirlee LatchMcLean to sign, will forward to Medical Records to follow up with patient.

## 2015-12-25 NOTE — Telephone Encounter (Signed)
Spoke with patient he is aware he needs to sign Release,make payment. I have provided him with my fax and phone he will fax FMLA to Medical Records fax.
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
## 2016-01-02 ENCOUNTER — Ambulatory Visit: Payer: 59 | Admitting: Physician Assistant

## 2016-01-06 ENCOUNTER — Emergency Department (HOSPITAL_COMMUNITY)
Admission: EM | Admit: 2016-01-06 | Discharge: 2016-01-07 | Disposition: A | Discharge status: Home / Self Care | Payer: 59 | Attending: Emergency Medicine | Admitting: Emergency Medicine

## 2016-01-06 ENCOUNTER — Emergency Department (HOSPITAL_COMMUNITY): Payer: 59

## 2016-01-06 ENCOUNTER — Encounter (HOSPITAL_COMMUNITY): Payer: Self-pay | Admitting: *Deleted

## 2016-01-06 DIAGNOSIS — J040 Acute laryngitis: Secondary | ICD-10-CM | POA: Diagnosis not present

## 2016-01-06 DIAGNOSIS — Z7982 Long term (current) use of aspirin: Secondary | ICD-10-CM | POA: Diagnosis not present

## 2016-01-06 DIAGNOSIS — Z79899 Other long term (current) drug therapy: Secondary | ICD-10-CM | POA: Diagnosis not present

## 2016-01-06 DIAGNOSIS — J40 Bronchitis, not specified as acute or chronic: Secondary | ICD-10-CM | POA: Diagnosis not present

## 2016-01-06 DIAGNOSIS — I2510 Atherosclerotic heart disease of native coronary artery without angina pectoris: Secondary | ICD-10-CM | POA: Diagnosis not present

## 2016-01-06 DIAGNOSIS — R0602 Shortness of breath: Secondary | ICD-10-CM | POA: Diagnosis present

## 2016-01-06 DIAGNOSIS — F17210 Nicotine dependence, cigarettes, uncomplicated: Secondary | ICD-10-CM | POA: Insufficient documentation

## 2016-01-06 DIAGNOSIS — Z955 Presence of coronary angioplasty implant and graft: Secondary | ICD-10-CM | POA: Diagnosis not present

## 2016-01-06 DIAGNOSIS — I10 Essential (primary) hypertension: Secondary | ICD-10-CM | POA: Insufficient documentation

## 2016-01-06 LAB — COMPREHENSIVE METABOLIC PANEL WITH GFR
ALT: 28 U/L (ref 17–63)
AST: 25 U/L (ref 15–41)
Albumin: 4 g/dL (ref 3.5–5.0)
Alkaline Phosphatase: 62 U/L (ref 38–126)
Anion gap: 11 (ref 5–15)
BUN: 9 mg/dL (ref 6–20)
CO2: 24 mmol/L (ref 22–32)
Calcium: 8.9 mg/dL (ref 8.9–10.3)
Chloride: 100 mmol/L — ABNORMAL LOW (ref 101–111)
Creatinine, Ser: 1.39 mg/dL — ABNORMAL HIGH (ref 0.61–1.24)
GFR calc Af Amer: >60 mL/min
GFR calc non Af Amer: >60 mL/min
Glucose, Bld: 110 mg/dL — ABNORMAL HIGH (ref 65–99)
Potassium: 3.8 mmol/L (ref 3.5–5.1)
Sodium: 135 mmol/L (ref 135–145)
Total Bilirubin: 0.5 mg/dL (ref 0.3–1.2)
Total Protein: 6.3 g/dL — ABNORMAL LOW (ref 6.5–8.1)

## 2016-01-06 LAB — CBC WITH DIFFERENTIAL/PLATELET
Basophils Absolute: 0 K/uL (ref 0.0–0.1)
Basophils Relative: 1 %
Eosinophils Absolute: 0.5 K/uL (ref 0.0–0.7)
Eosinophils Relative: 6 %
HCT: 47.1 % (ref 39.0–52.0)
Hemoglobin: 15.8 g/dL (ref 13.0–17.0)
Lymphocytes Relative: 20 %
Lymphs Abs: 1.6 K/uL (ref 0.7–4.0)
MCH: 29.6 pg (ref 26.0–34.0)
MCHC: 33.5 g/dL (ref 30.0–36.0)
MCV: 88.2 fL (ref 78.0–100.0)
Monocytes Absolute: 0.8 K/uL (ref 0.1–1.0)
Monocytes Relative: 9 %
Neutro Abs: 5.2 K/uL (ref 1.7–7.7)
Neutrophils Relative %: 64 %
Platelets: 214 K/uL (ref 150–400)
RBC: 5.34 MIL/uL (ref 4.22–5.81)
RDW: 13.4 % (ref 11.5–15.5)
WBC: 8.1 K/uL (ref 4.0–10.5)

## 2016-01-06 LAB — I-STAT TROPONIN, ED: Troponin i, poc: 0.02 ng/mL (ref 0.00–0.08)

## 2016-01-06 MED ORDER — IPRATROPIUM-ALBUTEROL 0.5-2.5 (3) MG/3ML IN SOLN
3.0000 mL | Freq: Once | RESPIRATORY_TRACT | Status: AC
  Administered 2016-01-07: 3 mL via RESPIRATORY_TRACT
  Filled 2016-01-06: qty 3

## 2016-01-06 MED ORDER — KETOROLAC TROMETHAMINE 15 MG/ML IJ SOLN
15.0000 mg | Freq: Once | INTRAMUSCULAR | Status: AC
  Administered 2016-01-07: 15 mg via INTRAMUSCULAR
  Filled 2016-01-06: qty 1
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
## 2016-01-06 NOTE — ED Triage Notes (Signed)
Recent stent placement.  States has been having a dry cough for about 1 week - hoarse at this time

## 2016-01-07 ENCOUNTER — Other Ambulatory Visit: Payer: Self-pay | Admitting: Cardiology

## 2016-01-07 MED ORDER — DEXAMETHASONE SODIUM PHOSPHATE 10 MG/ML IJ SOLN
10.0000 mg | Freq: Once | INTRAMUSCULAR | Status: AC
  Administered 2016-01-07: 10 mg via INTRAMUSCULAR
  Filled 2016-01-07: qty 1

## 2016-01-07 MED ORDER — ALBUTEROL SULFATE HFA 108 (90 BASE) MCG/ACT IN AERS
2.0000 | INHALATION_SPRAY | Freq: Once | RESPIRATORY_TRACT | Status: AC
  Administered 2016-01-07: 2 via RESPIRATORY_TRACT
  Filled 2016-01-07: qty 6.7

## 2016-01-07 MED ORDER — ALBUTEROL SULFATE HFA 108 (90 BASE) MCG/ACT IN AERS: 2.0000 | INHALATION_SPRAY | RESPIRATORY_TRACT | 0 refills | Status: DC | PRN

## 2016-01-07 MED ORDER — AZITHROMYCIN 250 MG PO TABS: 250.0000 mg | ORAL_TABLET | Freq: Every day | ORAL | 0 refills | Status: DC

## 2016-01-07 NOTE — ED Notes (Signed)
Pulse ox 95-96% on room air while ambulating in hallway. No distress noted.

## 2016-01-07 NOTE — ED Notes (Signed)
Cup of water provided to patient for fluid/PO challenge.

## 2016-01-11 ENCOUNTER — Other Ambulatory Visit: Payer: Self-pay | Admitting: *Deleted

## 2016-01-11 ENCOUNTER — Telehealth: Payer: Self-pay | Admitting: Cardiology

## 2016-01-11 MED ORDER — CLOPIDOGREL BISULFATE 75 MG PO TABS: 75.0000 mg | ORAL_TABLET | Freq: Every day | ORAL | 3 refills | Status: DC

## 2016-01-11 NOTE — Telephone Encounter (Signed)
Follow up   Pt wife is calling for the FMLA paper work   Please call pt or wife, call dropped as I was contacting rn

## 2016-01-11 NOTE — Telephone Encounter (Signed)
I will forward to Medical Records. 

## 2016-01-14 NOTE — Telephone Encounter (Signed)
FMLA paperwork given to Bing NeighborsScott W Friday 9/29 to see if he will complete. Lorin PicketScott has not seen patient yet so he is unsure if he will complete.

## 2016-01-15 ENCOUNTER — Encounter (INDEPENDENT_AMBULATORY_CARE_PROVIDER_SITE_OTHER): Payer: Self-pay

## 2016-01-15 ENCOUNTER — Ambulatory Visit (INDEPENDENT_AMBULATORY_CARE_PROVIDER_SITE_OTHER): Payer: 59 | Admitting: Physician Assistant

## 2016-01-15 ENCOUNTER — Encounter: Payer: Self-pay | Admitting: Physician Assistant

## 2016-01-15 VITALS — BP 134/72 | HR 90 | Ht 71.0 in | Wt 199.0 lb

## 2016-01-15 DIAGNOSIS — E7800 Pure hypercholesterolemia, unspecified: Secondary | ICD-10-CM

## 2016-01-15 DIAGNOSIS — R05 Cough: Secondary | ICD-10-CM

## 2016-01-15 DIAGNOSIS — Z720 Tobacco use: Secondary | ICD-10-CM | POA: Diagnosis not present

## 2016-01-15 DIAGNOSIS — I2510 Atherosclerotic heart disease of native coronary artery without angina pectoris: Secondary | ICD-10-CM

## 2016-01-15 DIAGNOSIS — R059 Cough, unspecified: Secondary | ICD-10-CM

## 2016-01-15 DIAGNOSIS — I119 Hypertensive heart disease without heart failure: Secondary | ICD-10-CM | POA: Diagnosis not present

## 2016-01-15 DIAGNOSIS — R58 Hemorrhage, not elsewhere classified: Secondary | ICD-10-CM

## 2016-01-15 LAB — CBC WITH DIFFERENTIAL/PLATELET
BASOS ABS: 0 {cells}/uL (ref 0–200)
BASOS PCT: 0 %
EOS ABS: 450 {cells}/uL (ref 15–500)
Eosinophils Relative: 5 %
HEMATOCRIT: 47.7 % (ref 38.5–50.0)
Hemoglobin: 16.2 g/dL (ref 13.2–17.1)
LYMPHS PCT: 33 %
Lymphs Abs: 2970 cells/uL (ref 850–3900)
MCH: 30.1 pg (ref 27.0–33.0)
MCHC: 34 g/dL (ref 32.0–36.0)
MCV: 88.5 fL (ref 80.0–100.0)
MONO ABS: 540 {cells}/uL (ref 200–950)
MONOS PCT: 6 %
MPV: 8.4 fL (ref 7.5–12.5)
Neutro Abs: 5040 cells/uL (ref 1500–7800)
Neutrophils Relative %: 56 %
PLATELETS: 281 10*3/uL (ref 140–400)
RBC: 5.39 MIL/uL (ref 4.20–5.80)
RDW: 14 % (ref 11.0–15.0)
WBC: 9 10*3/uL (ref 3.8–10.8)

## 2016-01-15 MED ORDER — AMLODIPINE BESYLATE 5 MG PO TABS: 5.0000 mg | ORAL_TABLET | Freq: Every day | ORAL | 3 refills | Status: DC

## 2016-01-15 NOTE — Telephone Encounter (Signed)
Pt scheduled for office visit with Tereso NewcomerScott Weaver, PA 01/15/16.
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
## 2016-01-15 NOTE — Patient Instructions (Addendum)
Medication Instructions:  1. STOP IMDUR  2. START AMLODIPINE 5 MG DAILY  Labwork: 1. TODAY CBC W/DIFF 2. FASTING LIPID AND LIVER IN 3 MONTHS  Testing/Procedures: NONE  Follow-Up: DR. Excell SeltzerOOPER 3 MONTHS   Any Other Special Instructions Will Be Listed Below (If Applicable). A REFERRAL HAS BEEN SENT TO CARDIAC REHAB; THEY WILL CALL YOU TO MAKE AN APPT  YOU HAVE BEEN ADVISED TO GO TO URGENT CARE IF YOUR COUGH WORSENS OR DOES NOT GET BETTER  If you need a refill on your cardiac medications before your next appointment, please call your pharmacy.

## 2016-01-16 ENCOUNTER — Telehealth: Payer: Self-pay | Admitting: *Deleted

## 2016-01-16 NOTE — Telephone Encounter (Signed)
Pt notified of lab results by phone with verbal understanding.  

## 2016-04-08 ENCOUNTER — Observation Stay (HOSPITAL_COMMUNITY)
Admission: EM | Admit: 2016-04-08 | Discharge: 2016-04-09 | Disposition: A | Discharge status: Home / Self Care | Payer: Self-pay | Attending: Cardiology | Admitting: Cardiology

## 2016-04-08 ENCOUNTER — Emergency Department (HOSPITAL_COMMUNITY): Payer: Self-pay

## 2016-04-08 ENCOUNTER — Encounter (HOSPITAL_COMMUNITY): Payer: Self-pay

## 2016-04-08 DIAGNOSIS — I209 Angina pectoris, unspecified: Secondary | ICD-10-CM | POA: Diagnosis present

## 2016-04-08 DIAGNOSIS — I119 Hypertensive heart disease without heart failure: Secondary | ICD-10-CM | POA: Insufficient documentation

## 2016-04-08 DIAGNOSIS — R079 Chest pain, unspecified: Secondary | ICD-10-CM

## 2016-04-08 DIAGNOSIS — Z7982 Long term (current) use of aspirin: Secondary | ICD-10-CM | POA: Insufficient documentation

## 2016-04-08 DIAGNOSIS — I2510 Atherosclerotic heart disease of native coronary artery without angina pectoris: Secondary | ICD-10-CM

## 2016-04-08 DIAGNOSIS — I25118 Atherosclerotic heart disease of native coronary artery with other forms of angina pectoris: Principal | ICD-10-CM | POA: Insufficient documentation

## 2016-04-08 DIAGNOSIS — F17210 Nicotine dependence, cigarettes, uncomplicated: Secondary | ICD-10-CM | POA: Insufficient documentation

## 2016-04-08 DIAGNOSIS — Z79899 Other long term (current) drug therapy: Secondary | ICD-10-CM | POA: Insufficient documentation

## 2016-04-08 DIAGNOSIS — Z955 Presence of coronary angioplasty implant and graft: Secondary | ICD-10-CM | POA: Insufficient documentation

## 2016-04-08 DIAGNOSIS — R0789 Other chest pain: Secondary | ICD-10-CM

## 2016-04-08 DIAGNOSIS — I208 Other forms of angina pectoris: Secondary | ICD-10-CM | POA: Diagnosis present

## 2016-04-08 LAB — BASIC METABOLIC PANEL
Anion gap: 8 (ref 5–15)
BUN: 10 mg/dL (ref 6–20)
CO2: 25 mmol/L (ref 22–32)
Calcium: 9.2 mg/dL (ref 8.9–10.3)
Chloride: 104 mmol/L (ref 101–111)
Creatinine, Ser: 0.97 mg/dL (ref 0.61–1.24)
GFR calc Af Amer: >60 mL/min (ref >60)
GLUCOSE: 101 mg/dL — AB (ref 65–99)
POTASSIUM: 4 mmol/L (ref 3.5–5.1)
Sodium: 137 mmol/L (ref 135–145)

## 2016-04-08 LAB — CBC
HEMATOCRIT: 48.5 % (ref 39.0–52.0)
Hemoglobin: 17.2 g/dL — ABNORMAL HIGH (ref 13.0–17.0)
MCH: 30.6 pg (ref 26.0–34.0)
MCHC: 35.5 g/dL (ref 30.0–36.0)
MCV: 86.3 fL (ref 78.0–100.0)
Platelets: 251 10*3/uL (ref 150–400)
RBC: 5.62 MIL/uL (ref 4.22–5.81)
RDW: 13.4 % (ref 11.5–15.5)
WBC: 9.3 10*3/uL (ref 4.0–10.5)

## 2016-04-08 LAB — TROPONIN I
Troponin I: <0.03 ng/mL (ref <0.03)
Troponin I: <0.03 ng/mL (ref <0.03)

## 2016-04-08 LAB — I-STAT TROPONIN, ED: Troponin i, poc: 0.01 ng/mL (ref 0.00–0.08)

## 2016-04-08 MED ORDER — ASPIRIN 81 MG PO CHEW
243.0000 mg | CHEWABLE_TABLET | Freq: Once | ORAL | Status: AC
  Administered 2016-04-08: 243 mg via ORAL
  Filled 2016-04-08: qty 3

## 2016-04-08 MED ORDER — ATORVASTATIN CALCIUM 40 MG PO TABS: 40.0000 mg | ORAL_TABLET | Freq: Every day | ORAL | Status: DC

## 2016-04-08 MED ORDER — ASPIRIN EC 81 MG PO TBEC
81.0000 mg | DELAYED_RELEASE_TABLET | Freq: Every day | ORAL | Status: DC
  Administered 2016-04-09: 81 mg via ORAL
  Filled 2016-04-08: qty 1

## 2016-04-08 MED ORDER — ONDANSETRON HCL 4 MG/2ML IJ SOLN: 4.0000 mg | Freq: Four times a day (QID) | INTRAMUSCULAR | Status: DC | PRN

## 2016-04-08 MED ORDER — CLOPIDOGREL BISULFATE 75 MG PO TABS
75.0000 mg | ORAL_TABLET | Freq: Every day | ORAL | Status: DC
  Administered 2016-04-09: 75 mg via ORAL
  Filled 2016-04-08: qty 1

## 2016-04-08 MED ORDER — ENOXAPARIN SODIUM 40 MG/0.4ML ~~LOC~~ SOLN: 40.0000 mg | SUBCUTANEOUS | Status: DC

## 2016-04-08 MED ORDER — ACETAMINOPHEN 325 MG PO TABS: 650.0000 mg | ORAL_TABLET | ORAL | Status: DC | PRN

## 2016-04-08 MED ORDER — LISINOPRIL 20 MG PO TABS
20.0000 mg | ORAL_TABLET | Freq: Every day | ORAL | Status: DC
  Administered 2016-04-09: 20 mg via ORAL
  Filled 2016-04-08: qty 1

## 2016-04-08 MED ORDER — NICOTINE 7 MG/24HR TD PT24
7.0000 mg | MEDICATED_PATCH | TRANSDERMAL | Status: DC
  Administered 2016-04-09: 7 mg via TRANSDERMAL
  Filled 2016-04-08 (×3): qty 1

## 2016-04-08 MED ORDER — AMLODIPINE BESYLATE 5 MG PO TABS
5.0000 mg | ORAL_TABLET | Freq: Every day | ORAL | Status: DC
Start: 2016-04-08 — End: 2016-04-09
  Administered 2016-04-08 – 2016-04-09 (×2): 5 mg via ORAL
  Filled 2016-04-08 (×2): qty 1

## 2016-04-08 MED ORDER — NITROGLYCERIN 0.4 MG SL SUBL: 0.4000 mg | SUBLINGUAL_TABLET | SUBLINGUAL | Status: DC | PRN

## 2016-04-08 MED ORDER — ALBUTEROL SULFATE (2.5 MG/3ML) 0.083% IN NEBU: 2.5000 mg | INHALATION_SOLUTION | RESPIRATORY_TRACT | Status: DC | PRN

## 2016-04-08 NOTE — ED Triage Notes (Signed)
Pt presents for evaluation of central chest discomfort starting this AM. Pt. Reports radiation to bilateral arms and jaw. Pt. Reports hx of stent placement. Pt took 1 nitro around 1245 with relief of pain. Pt. AxO x4.
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
## 2016-04-09 ENCOUNTER — Observation Stay (HOSPITAL_BASED_OUTPATIENT_CLINIC_OR_DEPARTMENT_OTHER): Payer: Self-pay

## 2016-04-09 DIAGNOSIS — R072 Precordial pain: Secondary | ICD-10-CM

## 2016-04-09 DIAGNOSIS — I200 Unstable angina: Secondary | ICD-10-CM

## 2016-04-09 DIAGNOSIS — R079 Chest pain, unspecified: Secondary | ICD-10-CM

## 2016-04-09 LAB — NM MYOCAR MULTI W/SPECT W/WALL MOTION / EF
CSEPEDS: 1 s
CSEPEW: 1 METS
Exercise duration (min): 5 min
Peak HR: 105 {beats}/min
Rest HR: 62 {beats}/min

## 2016-04-09 LAB — BASIC METABOLIC PANEL
Anion gap: 3 — ABNORMAL LOW (ref 5–15)
BUN: 9 mg/dL (ref 6–20)
CHLORIDE: 107 mmol/L (ref 101–111)
CO2: 29 mmol/L (ref 22–32)
CREATININE: 0.92 mg/dL (ref 0.61–1.24)
Calcium: 9.3 mg/dL (ref 8.9–10.3)
GFR calc non Af Amer: >60 mL/min (ref >60)
Glucose, Bld: 101 mg/dL — ABNORMAL HIGH (ref 65–99)
Potassium: 4.3 mmol/L (ref 3.5–5.1)
Sodium: 139 mmol/L (ref 135–145)

## 2016-04-09 LAB — TROPONIN I
Troponin I: <0.03 ng/mL (ref <0.03)
Troponin I: <0.03 ng/mL (ref <0.03)

## 2016-04-09 MED ORDER — NICOTINE 14 MG/24HR TD PT24: 14.0000 mg | MEDICATED_PATCH | Freq: Every day | TRANSDERMAL | 12 refills | Status: DC

## 2016-04-09 MED ORDER — NICOTINE 14 MG/24HR TD PT24
14.0000 mg | MEDICATED_PATCH | Freq: Every day | TRANSDERMAL | Status: DC
  Administered 2016-04-09: 14 mg via TRANSDERMAL
  Filled 2016-04-09: qty 1

## 2016-04-09 MED ORDER — REGADENOSON 0.4 MG/5ML IV SOLN
INTRAVENOUS | Status: AC
  Filled 2016-04-09: qty 5

## 2016-04-09 MED ORDER — REGADENOSON 0.4 MG/5ML IV SOLN
0.4000 mg | Freq: Once | INTRAVENOUS | Status: AC
  Administered 2016-04-09: 0.4 mg via INTRAVENOUS
  Filled 2016-04-09: qty 5

## 2016-04-09 MED ORDER — TECHNETIUM TC 99M TETROFOSMIN IV KIT
30.0000 | PACK | Freq: Once | INTRAVENOUS | Status: AC | PRN
  Administered 2016-04-09: 30 via INTRAVENOUS

## 2016-04-09 MED ORDER — TECHNETIUM TC 99M TETROFOSMIN IV KIT
10.0000 | PACK | Freq: Once | INTRAVENOUS | Status: AC | PRN
  Administered 2016-04-09: 10 via INTRAVENOUS

## 2016-04-09 NOTE — Progress Notes (Signed)
Patient presented for Lexiscan. Tolerated procedure well. Final stress test results pending.

## 2016-04-09 NOTE — Progress Notes (Addendum)
Patient Name: Lawson RadarShawn G Haas Date of Encounter: 04/09/2016  [facility]  Hospital Problem List     Active Problems:   Chest pain     Subjective   No chest pain or dyspnea.  Inpatient Medications    Scheduled Meds: . amLODipine  5 mg Oral Daily  . aspirin EC  81 mg Oral Daily  . atorvastatin  40 mg Oral q1800  . clopidogrel  75 mg Oral Q breakfast  . enoxaparin (LOVENOX) injection  40 mg Subcutaneous Q24H  . lisinopril  20 mg Oral Daily  . nicotine  7 mg Transdermal Q24H   Continuous Infusions:  PRN Meds: acetaminophen, albuterol, nitroGLYCERIN, ondansetron (ZOFRAN) IV   Vital Signs    Vitals:   04/08/16 2058 04/09/16 0300 04/09/16 0851 04/09/16 0854  BP: (!) 153/81 112/60 110/66 110/66  Pulse: 63 (!) 58    Resp:      Temp: 97.9 F (36.6 C) 97.8 F (36.6 C)    TempSrc: Oral Oral    SpO2: 98% 97%    Weight: 190 lb 8 oz (86.4 kg)     Height: 5\' 11"  (1.803 m)       Intake/Output Summary (Last 24 hours) at 04/09/16 0918 Last data filed at 04/08/16 2219  Gross per 24 hour  Intake              370 ml  Output              200 ml  Net              170 ml   Filed Weights   04/08/16 2058  Weight: 190 lb 8 oz (86.4 kg)    Physical Exam   GEN: Well nourished, well developed, in no acute distress.  HEENT: Grossly normal.  Neck: Supple Cardiac: RRR Respiratory:  CTA GI: Soft, nontender, nondistended. MS: no deformity or atrophy. Skin: warm and dry, no rash. Neuro:  Strength and sensation are intact. Psych: AAOx3.  Normal affect.  Labs    CBC  Recent Labs  04/08/16 1339  WBC 9.3  HGB 17.2*  HCT 48.5  MCV 86.3  PLT 251   Basic Metabolic Panel  Recent Labs  04/08/16 1339  NA 137  K 4.0  CL 104  CO2 25  GLUCOSE 101*  BUN 10  CREATININE 0.97  CALCIUM 9.2  Cardiac Enzymes  Recent Labs  04/08/16 1813 04/08/16 2056 04/09/16 0224  TROPONINI <0.03 <0.03 <0.03     Telemetry    sinus - Personally  Reviewed    Radiology    Dg Chest 2 View  Result Date: 04/08/2016 CLINICAL DATA:  Chest discomfort. EXAM: CHEST  2 VIEW COMPARISON:  01/06/2016. FINDINGS: Mediastinum hilar structures normal. Lungs are clear. No pleural effusion or pneumothorax. No acute bony abnormality identified. Diffuse thoracic cage osteopenia and degenerative change. Stable mild lower thoracic vertebral body compression fractures. IMPRESSION: No acute cardiopulmonary disease. Electronically Signed   By: Maisie Fushomas  Register   On: 04/08/2016 14:09     Patient Profile     35 yo male with PMH CAD admitted with atypical CP  Assessment & Plan    1 chest pain-symptoms atypical. Electrocardiogram shows no ST changes. Enzymes negative. No further symptoms. Discharge today and follow-up with Dr. Excell Seltzerooper.  2 tobacco abuse-patient counseled on discontinuing. Would prescribe nicotine patch at discharge.   3 coronary artery disease-continue aspirin, Plavix and statin.  4 hyperlipidemia-continue statin.  5 hypertension-blood pressure controlled. Continue  present medications.  Plan discharge today and follow-up with Dr. Excell Seltzerooper in 4-6 weeks. Greater than 30 minutes PA and physician time. D2  Signed, Olga MillersBrian Birdie Beveridge, MD  04/09/2016, 9:18 AM   Addendum-I was called back to the patient's room. His wife is extremely upset. She states something is wrong. She states something caused his chest pain and she thinks he is going to have a massive heart attack. She stated "can you guarantee me that he won't have a heart attack". 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 She pressed for a stress test. We will arrange a nuclear study today for risk stratification. I explained the risks of a stress test and if abnormal the risks of repeat cardiac catheterizations. If stress test normal patient can be discharged later today. Olga MillersBrian Jermisha Hoffart, MD

## 2016-04-09 NOTE — Discharge Summary (Cosign Needed)
Dictation #1 [id number]  [id number]     Discharge Summary    Patient ID: Wesley Newman,  MRN: [id number], DOB/AGE: [birth year] 35 y.o.  Admit date: 04/08/2016 Discharge date: 04/09/2016  Primary Care Provider: No PCP Per Patient Primary Cardiologist: Dr. Excell Seltzer   Discharge Diagnoses    Active Problems:   Chest pain   Allergies No Known Allergies  Diagnostic Studies/Procedures    EXAM: MYOCARDIAL IMAGING WITH SPECT (REST AND PHARMACOLOGIC-STRESS)  GATED LEFT VENTRICULAR WALL MOTION STUDY  LEFT VENTRICULAR EJECTION FRACTION  TECHNIQUE: Standard myocardial SPECT imaging was performed after resting intravenous injection of 10 mCi Tc-60m tetrofosmin. Subsequently, intravenous infusion of Lexiscan was performed under the supervision of the Cardiology staff. At peak effect of the drug, 30 mCi Tc-44m tetrofosmin was injected intravenously and standard myocardial SPECT imaging was performed. Quantitative gated imaging was also performed to evaluate left ventricular wall motion, and estimate left ventricular ejection fraction.  COMPARISON:  None available  FINDINGS: Perfusion: No decreased activity in the left ventricle on stress imaging to suggest reversible ischemia or infarction.  Wall Motion: Normal left ventricular wall motion. No left ventricular dilation.  Left Ventricular Ejection Fraction: 49 %  End diastolic volume 126 ml  End systolic volume 65 ml  IMPRESSION: 1. No reversible ischemia or infarction.  2. Normal left ventricular wall motion.  3. Left ventricular ejection fraction 49%  4. Non invasive risk stratification*: Low risk  _____________   History of Present Illness     Wesley Newman is a 35 y.o. male with a hx of HTN, HL, CAD, tobacco abuse. He has a hx of LHC in 2013 demonstrating non-obstructive disease.    He saw Dr. Marca Ancona in 11/29/15 with complaints of chest pain and LHC was arranged.  LHC demonstrated  pPDA with 70% stenosis that would be difficult to perform FFR.  He was placed on Imdur with plans for ETT-Myoview to assess for inferior ischemia.  However, before he could undergo stress testing, he presented to the hospital on 12/19/15 with symptoms c/w unstable angina. He underwent LHC and was treated with PCI to the RPDA with a DES.    He presented to the ED on 04/08/16 with chest pain while moving 10 pound converters. He took Nitroglycerin x 2, but pain did not resolve which prompted him to seek medical attention.   Hospital Course     His troponin was negative x 4. EKG showed NSR. He was chest pain free throughout admission.   He was referred for a Lexiscan Myoview, which resulted as low risk for ischemia.   We will continue Plavix and ASA. No changes were made to his medications.   _____________  Discharge Vitals Blood pressure 129/77, pulse 71, temperature 97.5 F (36.4 C), temperature source Oral, resp. rate 14, height 5\' 11"  (1.803 m), weight 190 lb 8 oz (86.4 kg), SpO2 98 %.  Filed Weights   04/08/16 2058  Weight: 190 lb 8 oz (86.4 kg)    Labs & Radiologic Studies     CBC  Recent Labs  04/08/16 1339  WBC 9.3  HGB 17.2*  HCT 48.5  MCV 86.3  PLT 251   Basic Metabolic Panel  Recent Labs  04/08/16 1339 04/09/16 0847  NA 137 139  K 4.0 4.3  CL 104 107  CO2 25 29  GLUCOSE 101* 101*  BUN 10 9  CREATININE 0.97 0.92  CALCIUM 9.2 9.3   Cardiac Enzymes  Recent Labs  04/08/16 2056 04/09/16 0224 04/09/16  0847  TROPONINI <0.03 <0.03 <0.03    Dg Chest 2 View  Result Date: 04/08/2016 CLINICAL DATA:  Chest discomfort. EXAM: CHEST  2 VIEW COMPARISON:  01/06/2016. FINDINGS: Mediastinum hilar structures normal. Lungs are clear. No pleural effusion or pneumothorax. No acute bony abnormality identified. Diffuse thoracic cage osteopenia and degenerative change. Stable mild lower thoracic vertebral body compression fractures. IMPRESSION: No acute cardiopulmonary  disease. Electronically Signed   By: Maisie Fushomas  Register   On: 04/08/2016 14:09   Nm Myocar Multi W/spect Izetta DakinW/wall Motion / Ef  Result Date: 04/09/2016 CLINICAL DATA:  Chest pain.  History of hypertension. EXAM: MYOCARDIAL IMAGING WITH SPECT (REST AND PHARMACOLOGIC-STRESS) GATED LEFT VENTRICULAR WALL MOTION STUDY LEFT VENTRICULAR EJECTION FRACTION TECHNIQUE: Standard myocardial SPECT imaging was performed after resting intravenous injection of 10 mCi Tc-2190m tetrofosmin. Subsequently, intravenous infusion of Lexiscan was performed under the supervision of the Cardiology staff. At peak effect of the drug, 30 mCi Tc-3690m tetrofosmin was injected intravenously and standard myocardial SPECT imaging was performed. Quantitative gated imaging was also performed to evaluate left ventricular wall motion, and estimate left ventricular ejection fraction. COMPARISON:  None available FINDINGS: Perfusion: No decreased activity in the left ventricle on stress imaging to suggest reversible ischemia or infarction. Wall Motion: Normal left ventricular wall motion. No left ventricular dilation. Left Ventricular Ejection Fraction: 49 % End diastolic volume 126 ml End systolic volume 65 ml IMPRESSION: 1. No reversible ischemia or infarction. 2. Normal left ventricular wall motion. 3. Left ventricular ejection fraction 49% 4. Non invasive risk stratification*: Low risk *2012 Appropriate Use Criteria for Coronary Revascularization Focused Update: J Am Coll Cardiol. 2012;59(9):857-881. http://content.dementiazones.comonlinejacc.org/article.aspx?articleid=1201161 Electronically Signed   By: Rudie MeyerP.  Gallerani M.D.   On: 04/09/2016 14:59    Disposition   Pt is being discharged home today in good condition.  Follow-up Plans & Appointments    Follow-up Information    Tereso NewcomerScott Weaver, PA-C Follow up on 05/07/2016.   Specialties:  Cardiology, Physician Assistant Why:  at 9:45am for follow up  Contact information: [address] [place] [id number] [phone]          Discharge Instructions    Diet - low sodium heart healthy    Complete by:  As directed    Increase activity slowly    Complete by:  As directed       Discharge Medications   Current Discharge Medication List    START taking these medications   Details  nicotine (NICODERM CQ - DOSED IN MG/24 HOURS) 14 mg/24hr patch Place 1 patch (14 mg total) onto the skin daily. Qty: 28 patch, Refills: 12      CONTINUE these medications which have NOT CHANGED   Details  albuterol (PROVENTIL HFA;VENTOLIN HFA) 108 (90 Base) MCG/ACT inhaler Inhale 2 puffs into the lungs every 4 (four) hours as needed for wheezing or shortness of breath. Qty: 1 Inhaler, Refills: 0    amLODipine (NORVASC) 5 MG tablet Take 1 tablet (5 mg total) by mouth daily. Qty: 90 tablet, Refills: 3   Associated Diagnoses: Hypertensive heart disease without heart failure    aspirin EC 81 MG tablet Take 1 tablet (81 mg total) by mouth daily.    atorvastatin (LIPITOR) 40 MG tablet TAKE ONE TABLET BY MOUTH DAILY Qty: 90 tablet, Refills: 3    clopidogrel (PLAVIX) 75 MG tablet Take 1 tablet (75 mg total) by mouth daily with breakfast. Qty: 90 tablet, Refills: 3    lisinopril (PRINIVIL,ZESTRIL) 20 MG tablet Take  1 tablet (20 mg total) by mouth daily. Qty: 90 tablet, Refills: 3    nitroGLYCERIN (NITROSTAT) 0.4 MG SL tablet Place 1 tablet (0.4 mg total) under the tongue every 5 (five) minutes x 3 doses as needed for chest pain. Qty: 25 tablet, Refills: 3      STOP taking these medications     buPROPion (WELLBUTRIN SR) 150 MG 12 hr tablet           Outstanding Labs/Studies     Duration of Discharge Encounter   Greater than 30 minutes including physician time.  Signed, Little IshikawaErin E Smith NP 04/09/2016, 4:09 PM

## 2016-05-07 ENCOUNTER — Ambulatory Visit: Payer: Self-pay | Admitting: Physician Assistant
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
## 2017-11-10 ENCOUNTER — Other Ambulatory Visit: Payer: Self-pay

## 2017-11-10 ENCOUNTER — Encounter (HOSPITAL_COMMUNITY): Payer: Self-pay | Admitting: Emergency Medicine

## 2017-11-10 ENCOUNTER — Emergency Department (HOSPITAL_COMMUNITY)
Admission: EM | Admit: 2017-11-10 | Discharge: 2017-11-10 | Disposition: A | Discharge status: Home / Self Care | Payer: Self-pay | Attending: Emergency Medicine | Admitting: Emergency Medicine

## 2017-11-10 DIAGNOSIS — I10 Essential (primary) hypertension: Secondary | ICD-10-CM | POA: Insufficient documentation

## 2017-11-10 DIAGNOSIS — W57XXXA Bitten or stung by nonvenomous insect and other nonvenomous arthropods, initial encounter: Secondary | ICD-10-CM | POA: Insufficient documentation

## 2017-11-10 DIAGNOSIS — Z7982 Long term (current) use of aspirin: Secondary | ICD-10-CM | POA: Insufficient documentation

## 2017-11-10 DIAGNOSIS — Y9389 Activity, other specified: Secondary | ICD-10-CM | POA: Insufficient documentation

## 2017-11-10 DIAGNOSIS — F17210 Nicotine dependence, cigarettes, uncomplicated: Secondary | ICD-10-CM | POA: Insufficient documentation

## 2017-11-10 DIAGNOSIS — S90861A Insect bite (nonvenomous), right foot, initial encounter: Secondary | ICD-10-CM | POA: Insufficient documentation

## 2017-11-10 DIAGNOSIS — Z7902 Long term (current) use of antithrombotics/antiplatelets: Secondary | ICD-10-CM | POA: Insufficient documentation

## 2017-11-10 DIAGNOSIS — Z955 Presence of coronary angioplasty implant and graft: Secondary | ICD-10-CM | POA: Insufficient documentation

## 2017-11-10 DIAGNOSIS — Z79899 Other long term (current) drug therapy: Secondary | ICD-10-CM | POA: Insufficient documentation

## 2017-11-10 DIAGNOSIS — I2510 Atherosclerotic heart disease of native coronary artery without angina pectoris: Secondary | ICD-10-CM | POA: Insufficient documentation

## 2017-11-10 DIAGNOSIS — Y929 Unspecified place or not applicable: Secondary | ICD-10-CM | POA: Insufficient documentation

## 2017-11-10 DIAGNOSIS — T63441A Toxic effect of venom of bees, accidental (unintentional), initial encounter: Secondary | ICD-10-CM

## 2017-11-10 DIAGNOSIS — Y998 Other external cause status: Secondary | ICD-10-CM | POA: Insufficient documentation

## 2017-11-10 MED ORDER — DOXYCYCLINE HYCLATE 100 MG PO CAPS: 100.0000 mg | ORAL_CAPSULE | Freq: Two times a day (BID) | ORAL | 0 refills | Status: DC

## 2017-11-10 MED ORDER — DEXAMETHASONE SODIUM PHOSPHATE 10 MG/ML IJ SOLN
10.0000 mg | Freq: Once | INTRAMUSCULAR | Status: AC
  Administered 2017-11-10: 10 mg via INTRAMUSCULAR
  Filled 2017-11-10: qty 1
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
## 2017-11-10 NOTE — ED Triage Notes (Signed)
Pt states he was stung by a bee on his right ankle. Pt states this happened on Sunday. Pt states he has taken benadryl with no relief.

## 2017-11-18 ENCOUNTER — Ambulatory Visit (INDEPENDENT_AMBULATORY_CARE_PROVIDER_SITE_OTHER): Payer: Self-pay | Admitting: Physician Assistant

## 2017-11-18 ENCOUNTER — Encounter: Payer: Self-pay | Admitting: Physician Assistant

## 2017-11-18 VITALS — BP 116/74 | HR 77 | Ht 71.0 in | Wt 193.8 lb

## 2017-11-18 DIAGNOSIS — R200 Anesthesia of skin: Secondary | ICD-10-CM

## 2017-11-18 DIAGNOSIS — Z720 Tobacco use: Secondary | ICD-10-CM

## 2017-11-18 DIAGNOSIS — R202 Paresthesia of skin: Secondary | ICD-10-CM

## 2017-11-18 DIAGNOSIS — E7800 Pure hypercholesterolemia, unspecified: Secondary | ICD-10-CM

## 2017-11-18 DIAGNOSIS — I2510 Atherosclerotic heart disease of native coronary artery without angina pectoris: Secondary | ICD-10-CM

## 2017-11-18 DIAGNOSIS — I119 Hypertensive heart disease without heart failure: Secondary | ICD-10-CM

## 2017-11-18 MED ORDER — CARVEDILOL 3.125 MG PO TABS: 3.1250 mg | ORAL_TABLET | Freq: Two times a day (BID) | ORAL | 1 refills | Status: DC

## 2017-11-18 MED ORDER — LISINOPRIL 20 MG PO TABS: 20.0000 mg | ORAL_TABLET | Freq: Every day | ORAL | 1 refills | Status: DC

## 2017-11-18 MED ORDER — ATORVASTATIN CALCIUM 40 MG PO TABS: 40.0000 mg | ORAL_TABLET | Freq: Every day | ORAL | 1 refills | Status: DC

## 2017-11-18 NOTE — Patient Instructions (Addendum)
Medication Instructions:  Your physician has recommended you make the following change in your medication:  1.  START Atorvastatin 40 mg taking 1 tablet each night with dinner 2.  START Coreg 3.125 taking 1 tablet by mouth twice a day 3.  START Nicotine Patch 21 mg wear 1 patch daily  Your medications have been sent to your pharmacy.  Labwork: TOMORROW IN THE [place] OFFICE [address] [place], [place] [address]  :  CMET, LIPID, CBC, & HGBA1C  Testing/Procedures: None ordered  Follow-Up: Your physician recommends that you schedule a follow-up appointment in: 4-6 WEEKS IN THE [place] OFFICE    Any Other Special Instructions Will Be Listed Below (If Applicable).     If you need a refill on your cardiac medications before your next appointment, please call your pharmacy.
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
## 2017-11-19 ENCOUNTER — Other Ambulatory Visit (HOSPITAL_COMMUNITY)
Admission: RE | Admit: 2017-11-19 | Discharge: 2017-11-19 | Disposition: A | Discharge status: Home / Self Care | Payer: Self-pay | Source: Ambulatory Visit | Attending: Physician Assistant | Admitting: Physician Assistant

## 2017-11-19 DIAGNOSIS — I2510 Atherosclerotic heart disease of native coronary artery without angina pectoris: Secondary | ICD-10-CM | POA: Insufficient documentation

## 2017-11-19 DIAGNOSIS — I119 Hypertensive heart disease without heart failure: Secondary | ICD-10-CM | POA: Insufficient documentation

## 2017-11-19 DIAGNOSIS — E7800 Pure hypercholesterolemia, unspecified: Secondary | ICD-10-CM | POA: Insufficient documentation

## 2017-11-19 LAB — COMPREHENSIVE METABOLIC PANEL
ALBUMIN: 4.1 g/dL (ref 3.5–5.0)
ALK PHOS: 68 U/L (ref 38–126)
ALT: 23 U/L (ref 0–44)
ANION GAP: 6 (ref 5–15)
AST: 19 U/L (ref 15–41)
BUN: 11 mg/dL (ref 6–20)
CALCIUM: 8.9 mg/dL (ref 8.9–10.3)
CHLORIDE: 106 mmol/L (ref 98–111)
CO2: 24 mmol/L (ref 22–32)
Creatinine, Ser: 0.85 mg/dL (ref 0.61–1.24)
GFR calc non Af Amer: >60 mL/min (ref >60)
GLUCOSE: 103 mg/dL — AB (ref 70–99)
Potassium: 4 mmol/L (ref 3.5–5.1)
SODIUM: 136 mmol/L (ref 135–145)
Total Bilirubin: 0.9 mg/dL (ref 0.3–1.2)
Total Protein: 6.8 g/dL (ref 6.5–8.1)

## 2017-11-19 LAB — CBC
HEMATOCRIT: 47.2 % (ref 39.0–52.0)
Hemoglobin: 16.5 g/dL (ref 13.0–17.0)
MCH: 31 pg (ref 26.0–34.0)
MCHC: 35 g/dL (ref 30.0–36.0)
MCV: 88.6 fL (ref 78.0–100.0)
PLATELETS: 269 10*3/uL (ref 150–400)
RBC: 5.33 MIL/uL (ref 4.22–5.81)
RDW: 13.6 % (ref 11.5–15.5)
WBC: 10.6 10*3/uL — ABNORMAL HIGH (ref 4.0–10.5)

## 2017-11-19 LAB — LIPID PANEL
CHOL/HDL RATIO: 5.2 ratio
Cholesterol: 194 mg/dL (ref 0–200)
HDL: 37 mg/dL — AB (ref >40)
LDL Cholesterol: 136 mg/dL — ABNORMAL HIGH (ref 0–99)
Triglycerides: 107 mg/dL (ref <150)
VLDL: 21 mg/dL (ref 0–40)

## 2017-11-19 LAB — HEMOGLOBIN A1C
Hgb A1c MFr Bld: 5.4 % (ref 4.8–5.6)
Mean Plasma Glucose: 108.28 mg/dL

## 2017-11-20 ENCOUNTER — Other Ambulatory Visit: Payer: Self-pay

## 2017-11-20 DIAGNOSIS — E785 Hyperlipidemia, unspecified: Secondary | ICD-10-CM

## 2017-11-20 MED ORDER — LISINOPRIL 20 MG PO TABS: 20.0000 mg | ORAL_TABLET | Freq: Every day | ORAL | 3 refills | Status: DC

## 2017-11-20 MED ORDER — ATORVASTATIN CALCIUM 40 MG PO TABS: 40.0000 mg | ORAL_TABLET | Freq: Every day | ORAL | 3 refills | Status: DC

## 2018-01-01 ENCOUNTER — Other Ambulatory Visit: Payer: Self-pay | Admitting: *Deleted

## 2018-01-05 ENCOUNTER — Ambulatory Visit (INDEPENDENT_AMBULATORY_CARE_PROVIDER_SITE_OTHER): Payer: Self-pay | Admitting: Cardiology

## 2018-01-05 ENCOUNTER — Other Ambulatory Visit (HOSPITAL_COMMUNITY)
Admission: RE | Admit: 2018-01-05 | Discharge: 2018-01-05 | Disposition: A | Discharge status: Home / Self Care | Payer: Self-pay | Source: Ambulatory Visit | Attending: Cardiology | Admitting: Cardiology

## 2018-01-05 ENCOUNTER — Encounter: Payer: Self-pay | Admitting: Cardiology

## 2018-01-05 ENCOUNTER — Other Ambulatory Visit: Payer: Self-pay

## 2018-01-05 VITALS — BP 140/74 | HR 85 | Ht 69.0 in | Wt 197.0 lb

## 2018-01-05 DIAGNOSIS — E782 Mixed hyperlipidemia: Secondary | ICD-10-CM

## 2018-01-05 DIAGNOSIS — I25119 Atherosclerotic heart disease of native coronary artery with unspecified angina pectoris: Secondary | ICD-10-CM | POA: Insufficient documentation

## 2018-01-05 DIAGNOSIS — Z720 Tobacco use: Secondary | ICD-10-CM

## 2018-01-05 LAB — LIPID PANEL
CHOLESTEROL: 170 mg/dL (ref 0–200)
HDL: 43 mg/dL (ref >40)
LDL Cholesterol: 109 mg/dL — ABNORMAL HIGH (ref 0–99)
Total CHOL/HDL Ratio: 4 RATIO
Triglycerides: 88 mg/dL (ref <150)
VLDL: 18 mg/dL (ref 0–40)

## 2018-01-05 LAB — HEPATIC FUNCTION PANEL
ALBUMIN: 4.3 g/dL (ref 3.5–5.0)
ALK PHOS: 79 U/L (ref 38–126)
ALT: 32 U/L (ref 0–44)
AST: 23 U/L (ref 15–41)
BILIRUBIN TOTAL: 0.9 mg/dL (ref 0.3–1.2)
Bilirubin, Direct: 0.1 mg/dL (ref 0.0–0.2)
Indirect Bilirubin: 0.8 mg/dL (ref 0.3–0.9)
Total Protein: 7.1 g/dL (ref 6.5–8.1)

## 2018-01-05 NOTE — Patient Instructions (Addendum)
Your physician wants you to follow-up in: 6 months with Dr.McDowell You will receive a reminder letter in the mail two months in advance. If you don't receive a letter, please call our office to schedule the follow-up appointment.   STOP Plavix and you finish this bottle.   Continue aspirin 81 mg daily   Take atorvastatin at dinner or bedtime    Get lab work today; lft. lipid    No tests ordered      Thank you for choosing [facility] !
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
## 2018-05-05 ENCOUNTER — Ambulatory Visit (INDEPENDENT_AMBULATORY_CARE_PROVIDER_SITE_OTHER): Payer: BLUE CROSS/BLUE SHIELD | Admitting: Cardiology

## 2018-05-05 ENCOUNTER — Encounter: Payer: Self-pay | Admitting: Cardiology

## 2018-05-05 ENCOUNTER — Other Ambulatory Visit: Payer: Self-pay | Admitting: Cardiology

## 2018-05-05 VITALS — BP 124/88 | HR 89 | Ht 69.0 in | Wt 211.8 lb

## 2018-05-05 DIAGNOSIS — I200 Unstable angina: Secondary | ICD-10-CM

## 2018-05-05 DIAGNOSIS — E782 Mixed hyperlipidemia: Secondary | ICD-10-CM | POA: Diagnosis not present

## 2018-05-05 DIAGNOSIS — Z01818 Encounter for other preprocedural examination: Secondary | ICD-10-CM | POA: Diagnosis not present

## 2018-05-05 DIAGNOSIS — I25119 Atherosclerotic heart disease of native coronary artery with unspecified angina pectoris: Secondary | ICD-10-CM

## 2018-05-05 DIAGNOSIS — Z720 Tobacco use: Secondary | ICD-10-CM

## 2018-05-05 MED ORDER — ATORVASTATIN CALCIUM 80 MG PO TABS: 80.0000 mg | ORAL_TABLET | Freq: Every day | ORAL | 3 refills | Status: DC

## 2018-05-05 MED ORDER — NITROGLYCERIN 0.4 MG SL SUBL: 0.4000 mg | SUBLINGUAL_TABLET | SUBLINGUAL | 3 refills | Status: DC | PRN
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
## 2018-05-05 NOTE — Patient Instructions (Signed)
Medication Instructions:  Increase lipitor to 80 mg daily  Labwork: Monday  bmet Cbc   Testing/Procedures: Your physician has requested that you have a cardiac catheterization. Cardiac catheterization is used to diagnose and/or treat various heart conditions. Doctors may recommend this procedure for a number of different reasons. The most common reason is to evaluate chest pain. Chest pain can be a symptom of coronary artery disease (CAD), and cardiac catheterization can show whether plaque is narrowing or blocking your heart's arteries. This procedure is also used to evaluate the valves, as well as measure the blood flow and oxygen levels in different parts of your heart. For further information please visit [email or web address] Please follow instruction sheet, as given.    Follow-Up: Your physician recommends that you schedule a follow-up appointment in: 4-6 weeks   Any Other Special Instructions Will Be Listed Below (If Applicable).     If you need a refill on your cardiac medications before your next appointment, please call your pharmacy.    [facility] [address] [place] [address] Dept: [phone] Loc: [phone]  ALIKA KRISKO  05/05/2018  You are scheduled for a Cardiac Catheterization on Wednesday, January 29 with Dr. Lance Muss.  1. Please arrive at the [facility] (Main Entrance A) at [facility]: [address] [place], [place] [address] at 6:30 AM (This time is two hours before your procedure to ensure your preparation). Free valet parking service is available.   Special note: Every effort is made to have your procedure done on time. Please understand that emergencies sometimes delay scheduled procedures.  2. Diet: Do not eat solid foods after midnight.  The patient may have clear liquids until 5am upon the day of the procedure.  3. Labs: Monday [facility]  4.  Medication instructions in preparation for your procedure:   Contrast Allergy: No    On the morning of your procedure, take your Aspirin and any morning medicines NOT listed above.  You may use sips of water.  5. Plan for one night stay--bring personal belongings. 6. Bring a current list of your medications and current insurance cards.3 7. You MUST have a responsible person to drive you home. 8. Someone MUST be with you the first 24 hours after you arrive home or your discharge will be delayed. 9. Please wear clothes that are easy to get on and off and wear slip-on shoes.  Thank you for allowing [place] to care for you!   -- [facility]

## 2018-05-10 ENCOUNTER — Other Ambulatory Visit (HOSPITAL_COMMUNITY)
Admission: RE | Admit: 2018-05-10 | Discharge: 2018-05-10 | Disposition: A | Discharge status: Home / Self Care | Payer: BLUE CROSS/BLUE SHIELD | Source: Ambulatory Visit | Attending: Cardiology | Admitting: Cardiology

## 2018-05-10 DIAGNOSIS — Z01818 Encounter for other preprocedural examination: Secondary | ICD-10-CM | POA: Diagnosis not present

## 2018-05-10 LAB — CBC WITH DIFFERENTIAL/PLATELET
Abs Immature Granulocytes: 0.05 10*3/uL (ref 0.00–0.07)
Basophils Absolute: 0.1 10*3/uL (ref 0.0–0.1)
Basophils Relative: 1 %
Eosinophils Absolute: 0.4 10*3/uL (ref 0.0–0.5)
Eosinophils Relative: 4 %
HCT: 50.4 % (ref 39.0–52.0)
HEMOGLOBIN: 16.9 g/dL (ref 13.0–17.0)
Immature Granulocytes: 1 %
Lymphocytes Relative: 22 %
Lymphs Abs: 2 10*3/uL (ref 0.7–4.0)
MCH: 29.4 pg (ref 26.0–34.0)
MCHC: 33.5 g/dL (ref 30.0–36.0)
MCV: 87.7 fL (ref 80.0–100.0)
Monocytes Absolute: 0.6 10*3/uL (ref 0.1–1.0)
Monocytes Relative: 6 %
NEUTROS PCT: 66 %
Neutro Abs: 6.2 10*3/uL (ref 1.7–7.7)
Platelets: 260 10*3/uL (ref 150–400)
RBC: 5.75 MIL/uL (ref 4.22–5.81)
RDW: 13.2 % (ref 11.5–15.5)
WBC: 9.2 10*3/uL (ref 4.0–10.5)
nRBC: 0 % (ref 0.0–0.2)

## 2018-05-10 LAB — BASIC METABOLIC PANEL
Anion gap: 8 (ref 5–15)
BUN: 11 mg/dL (ref 6–20)
CHLORIDE: 102 mmol/L (ref 98–111)
CO2: 25 mmol/L (ref 22–32)
Calcium: 9.1 mg/dL (ref 8.9–10.3)
Creatinine, Ser: 0.87 mg/dL (ref 0.61–1.24)
GFR calc Af Amer: >60 mL/min (ref >60)
GFR calc non Af Amer: >60 mL/min (ref >60)
Glucose, Bld: 104 mg/dL — ABNORMAL HIGH (ref 70–99)
Potassium: 3.9 mmol/L (ref 3.5–5.1)
Sodium: 135 mmol/L (ref 135–145)

## 2018-05-11 ENCOUNTER — Telehealth: Payer: Self-pay | Admitting: *Deleted

## 2018-05-11 NOTE — Telephone Encounter (Signed)
Pt contacted pre-catheterization scheduled at [facility] for: Wednesday May 12, 2018 8:30 AM Verified arrival time and place: [facility] Main Entrance A at: 6:30 AM  No solid food after midnight prior to cath, clear liquids until 5 AM day of procedure. Contrast allergy: no Verified no diabetes medications.  AM meds can be  taken pre-cath with sip of water including: ASA 81 mg  Confirmed patient has responsible person to drive home post procedure and for 24 hours after you arrive home: yes

## 2018-05-12 ENCOUNTER — Other Ambulatory Visit: Payer: Self-pay

## 2018-05-12 ENCOUNTER — Encounter (HOSPITAL_COMMUNITY)
Admission: RE | Disposition: A | Discharge status: Home / Self Care | Payer: Self-pay | Source: Home / Self Care | Attending: Interventional Cardiology

## 2018-05-12 ENCOUNTER — Ambulatory Visit (HOSPITAL_COMMUNITY)
Admission: RE | Admit: 2018-05-12 | Discharge: 2018-05-12 | Disposition: A | Discharge status: Home / Self Care | Payer: BLUE CROSS/BLUE SHIELD | Attending: Interventional Cardiology | Admitting: Interventional Cardiology

## 2018-05-12 DIAGNOSIS — F17210 Nicotine dependence, cigarettes, uncomplicated: Secondary | ICD-10-CM | POA: Insufficient documentation

## 2018-05-12 DIAGNOSIS — I119 Hypertensive heart disease without heart failure: Secondary | ICD-10-CM | POA: Insufficient documentation

## 2018-05-12 DIAGNOSIS — E785 Hyperlipidemia, unspecified: Secondary | ICD-10-CM | POA: Insufficient documentation

## 2018-05-12 DIAGNOSIS — I200 Unstable angina: Secondary | ICD-10-CM

## 2018-05-12 DIAGNOSIS — Z8249 Family history of ischemic heart disease and other diseases of the circulatory system: Secondary | ICD-10-CM | POA: Insufficient documentation

## 2018-05-12 DIAGNOSIS — Z955 Presence of coronary angioplasty implant and graft: Secondary | ICD-10-CM | POA: Insufficient documentation

## 2018-05-12 DIAGNOSIS — Z7982 Long term (current) use of aspirin: Secondary | ICD-10-CM | POA: Diagnosis not present

## 2018-05-12 DIAGNOSIS — Z79899 Other long term (current) drug therapy: Secondary | ICD-10-CM | POA: Diagnosis not present

## 2018-05-12 DIAGNOSIS — I25110 Atherosclerotic heart disease of native coronary artery with unstable angina pectoris: Secondary | ICD-10-CM | POA: Insufficient documentation

## 2018-05-12 HISTORY — PX: LEFT HEART CATH AND CORONARY ANGIOGRAPHY: CATH118249

## 2018-05-12 SURGERY — LEFT HEART CATH AND CORONARY ANGIOGRAPHY
Anesthesia: LOCAL

## 2018-05-12 MED ORDER — SODIUM CHLORIDE 0.9 % WEIGHT BASED INFUSION: 1.0000 mL/kg/h | INTRAVENOUS | Status: DC

## 2018-05-12 MED ORDER — MIDAZOLAM HCL 2 MG/2ML IJ SOLN
INTRAMUSCULAR | Status: AC
  Filled 2018-05-12: qty 2

## 2018-05-12 MED ORDER — MIDAZOLAM HCL 2 MG/2ML IJ SOLN
INTRAMUSCULAR | Status: DC | PRN
  Administered 2018-05-12: 1 mg via INTRAVENOUS

## 2018-05-12 MED ORDER — VERAPAMIL HCL 2.5 MG/ML IV SOLN
INTRAVENOUS | Status: DC | PRN
  Administered 2018-05-12: 10 mL via INTRA_ARTERIAL

## 2018-05-12 MED ORDER — FENTANYL CITRATE (PF) 100 MCG/2ML IJ SOLN
INTRAMUSCULAR | Status: AC
  Filled 2018-05-12: qty 2

## 2018-05-12 MED ORDER — SODIUM CHLORIDE 0.9% FLUSH: 3.0000 mL | Freq: Two times a day (BID) | INTRAVENOUS | Status: DC

## 2018-05-12 MED ORDER — ASPIRIN 81 MG PO CHEW: 81.0000 mg | CHEWABLE_TABLET | ORAL | Status: DC

## 2018-05-12 MED ORDER — IOPAMIDOL (ISOVUE-370) INJECTION 76%
INTRAVENOUS | Status: DC | PRN
  Administered 2018-05-12: 70 mL via INTRAVENOUS

## 2018-05-12 MED ORDER — NITROGLYCERIN 1 MG/10 ML FOR IR/CATH LAB
INTRA_ARTERIAL | Status: DC | PRN
  Administered 2018-05-12: 200 ug via INTRACORONARY

## 2018-05-12 MED ORDER — SODIUM CHLORIDE 0.9 % WEIGHT BASED INFUSION
3.0000 mL/kg/h | INTRAVENOUS | Status: AC
  Administered 2018-05-12: 3 mL/kg/h via INTRAVENOUS

## 2018-05-12 MED ORDER — LIDOCAINE HCL (PF) 1 % IJ SOLN
INTRAMUSCULAR | Status: DC | PRN
  Administered 2018-05-12: 2 mL

## 2018-05-12 MED ORDER — ACETAMINOPHEN 325 MG PO TABS: 650.0000 mg | ORAL_TABLET | ORAL | Status: DC | PRN

## 2018-05-12 MED ORDER — SODIUM CHLORIDE 0.9 % IV SOLN: 250.0000 mL | INTRAVENOUS | Status: DC | PRN

## 2018-05-12 MED ORDER — FENTANYL CITRATE (PF) 100 MCG/2ML IJ SOLN
INTRAMUSCULAR | Status: DC | PRN
  Administered 2018-05-12: 25 ug via INTRAVENOUS

## 2018-05-12 MED ORDER — NITROGLYCERIN 1 MG/10 ML FOR IR/CATH LAB
INTRA_ARTERIAL | Status: AC
  Filled 2018-05-12: qty 10

## 2018-05-12 MED ORDER — SODIUM CHLORIDE 0.9% FLUSH: 3.0000 mL | INTRAVENOUS | Status: DC | PRN

## 2018-05-12 MED ORDER — HEPARIN SODIUM (PORCINE) 1000 UNIT/ML IJ SOLN
INTRAMUSCULAR | Status: DC | PRN
  Administered 2018-05-12: 4500 [IU] via INTRAVENOUS

## 2018-05-12 MED ORDER — VERAPAMIL HCL 2.5 MG/ML IV SOLN
INTRAVENOUS | Status: AC
  Filled 2018-05-12: qty 2

## 2018-05-12 MED ORDER — ONDANSETRON HCL 4 MG/2ML IJ SOLN: 4.0000 mg | Freq: Four times a day (QID) | INTRAMUSCULAR | Status: DC | PRN

## 2018-05-12 MED ORDER — MIDAZOLAM HCL 2 MG/2ML IJ SOLN
INTRAMUSCULAR | Status: DC | PRN
  Administered 2018-05-12: 2 mg via INTRAVENOUS

## 2018-05-12 MED ORDER — HEPARIN (PORCINE) IN NACL 1000-0.9 UT/500ML-% IV SOLN
INTRAVENOUS | Status: AC
  Filled 2018-05-12: qty 1000

## 2018-05-12 MED ORDER — HEPARIN (PORCINE) IN NACL 1000-0.9 UT/500ML-% IV SOLN
INTRAVENOUS | Status: DC | PRN
  Administered 2018-05-12 (×2): 500 mL

## 2018-05-12 MED ORDER — LIDOCAINE HCL (PF) 1 % IJ SOLN
INTRAMUSCULAR | Status: AC
  Filled 2018-05-12: qty 30

## 2018-05-12 MED ORDER — SODIUM CHLORIDE 0.9 % IV SOLN: INTRAVENOUS | Status: AC

## 2018-05-12 SURGICAL SUPPLY — 10 items
CATH 5FR JL3.5 JR4 ANG PIG MP (CATHETERS) ×1 IMPLANT
DEVICE RAD COMP TR BAND LRG (VASCULAR PRODUCTS) ×1 IMPLANT
GLIDESHEATH SLEND SS 6F .021 (SHEATH) ×1 IMPLANT
GUIDEWIRE INQWIRE 1.5J.035X260 (WIRE) IMPLANT
INQWIRE 1.5J .035X260CM (WIRE) ×2
KIT HEART LEFT (KITS) ×2 IMPLANT
PACK CARDIAC CATHETERIZATION (CUSTOM PROCEDURE TRAY) ×2 IMPLANT
SHEATH PROBE COVER 6X72 (BAG) ×1 IMPLANT
TRANSDUCER W/STOPCOCK (MISCELLANEOUS) ×2 IMPLANT
TUBING CIL FLEX 10 FLL-RA (TUBING) ×2 IMPLANT

## 2018-05-12 NOTE — Interval H&P Note (Signed)
Cath Lab Visit (complete for each Cath Lab visit)  Clinical Evaluation Leading to the Procedure:   ACS: No.  Non-ACS:    Anginal Classification: CCS III  Anti-ischemic medical therapy: Minimal Therapy (1 class of medications)  Non-Invasive Test Results: No non-invasive testing performed  Prior CABG: No previous CABG      History and Physical Interval Note:  05/12/2018 8:32 AM  Wesley Newman  has presented today for surgery, with the diagnosis of angina  The various methods of treatment have been discussed with the patient and family. After consideration of risks, benefits and other options for treatment, the patient has consented to  Procedure(s): LEFT HEART CATH AND CORONARY ANGIOGRAPHY (N/A) as a surgical intervention .  The patient's history has been reviewed, patient examined, no change in status, stable for surgery.  I have reviewed the patient's chart and labs.  Questions were answered to the patient's satisfaction.     Lance MussJayadeep Anaijah Augsburger

## 2018-05-12 NOTE — Discharge Instructions (Signed)
Radial Site Care ° °This sheet gives you information about how to care for yourself after your procedure. Your health care provider may also give you more specific instructions. If you have problems or questions, contact your health care provider. °What can I expect after the procedure? °After the procedure, it is common to have: °· Bruising and tenderness at the catheter insertion area. °Follow these instructions at home: °Medicines °· Take over-the-counter and prescription medicines only as told by your health care provider. °Insertion site care °· Follow instructions from your health care provider about how to take care of your insertion site. Make sure you: °? Wash your hands with soap and water before you change your bandage (dressing). If soap and water are not available, use hand sanitizer. °? Change your dressing as told by your health care provider. °? Leave stitches (sutures), skin glue, or adhesive strips in place. These skin closures may need to stay in place for 2 weeks or longer. If adhesive strip edges start to loosen and curl up, you may trim the loose edges. Do not remove adhesive strips completely unless your health care provider tells you to do that. °· Check your insertion site every day for signs of infection. Check for: °? Redness, swelling, or pain. °? Fluid or blood. °? Pus or a bad smell. °? Warmth. °· Do not take baths, swim, or use a hot tub until your health care provider approves. °· You may shower 24-48 hours after the procedure, or as directed by your health care provider. °? Remove the dressing and gently wash the site with plain soap and water. °? Pat the area dry with a clean towel. °? Do not rub the site. That could cause bleeding. °· Do not apply powder or lotion to the site. °Activity ° °· For 24 hours after the procedure, or as directed by your health care provider: °? Do not flex or bend the affected arm. °? Do not push or pull heavy objects with the affected arm. °? Do not  drive yourself home from the hospital or clinic. You may drive 24 hours after the procedure unless your health care provider tells you not to. °? Do not operate machinery or power tools. °· Do not lift anything that is heavier than 10 lb (4.5 kg), or the limit that you are told, until your health care provider says that it is safe. °· Ask your health care provider when it is okay to: °? Return to work or school. °? Resume usual physical activities or sports. °? Resume sexual activity. °General instructions °· If the catheter site starts to bleed, raise your arm and put firm pressure on the site. If the bleeding does not stop, get help right away. This is a medical emergency. °· If you went home on the same day as your procedure, a responsible adult should be with you for the first 24 hours after you arrive home. °· Keep all follow-up visits as told by your health care provider. This is important. °Contact a health care provider if: °· You have a fever. °· You have redness, swelling, or yellow drainage around your insertion site. °Get help right away if: °· You have unusual pain at the radial site. °· The catheter insertion area swells very fast. °· The insertion area is bleeding, and the bleeding does not stop when you hold steady pressure on the area. °· Your arm or hand becomes pale, cool, tingly, or numb. °These symptoms may represent a serious problem   that is an emergency. Do not wait to see if the symptoms will go away. Get medical help right away. Call your local emergency services (911 in the [place] Do not drive yourself to the hospital. °Summary °· After the procedure, it is common to have bruising and tenderness at the site. °· Follow instructions from your health care provider about how to take care of your radial site wound. Check the wound every day for signs of infection. °· Do not lift anything that is heavier than 10 lb (4.5 kg), or the limit that you are told, until your health care provider says  that it is safe. °This information is not intended to replace advice given to you by your health care provider. Make sure you discuss any questions you have with your health care provider. °Document Released: 05/03/2010 Document Revised: 05/06/2017 Document Reviewed: 05/06/2017 °Elsevier Interactive Patient Education © 2019 [employer] ° °

## 2018-05-13 ENCOUNTER — Encounter (HOSPITAL_COMMUNITY): Payer: Self-pay | Admitting: Interventional Cardiology

## 2018-06-07 ENCOUNTER — Ambulatory Visit: Payer: BLUE CROSS/BLUE SHIELD | Admitting: Cardiology

## 2018-06-07 DIAGNOSIS — R0989 Other specified symptoms and signs involving the circulatory and respiratory systems: Secondary | ICD-10-CM
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
## 2018-06-08 ENCOUNTER — Encounter: Payer: Self-pay | Admitting: Cardiology

## 2018-07-08 ENCOUNTER — Ambulatory Visit: Payer: Self-pay | Admitting: Cardiology

## 2019-01-06 ENCOUNTER — Other Ambulatory Visit: Payer: Self-pay | Admitting: Physician Assistant

## 2019-02-17 ENCOUNTER — Other Ambulatory Visit: Payer: Self-pay | Admitting: Physician Assistant

## 2019-06-27 ENCOUNTER — Other Ambulatory Visit: Payer: Self-pay | Admitting: Physician Assistant

## 2019-07-08 ENCOUNTER — Ambulatory Visit: Payer: BLUE CROSS/BLUE SHIELD | Admitting: Cardiology
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
## 2019-07-14 ENCOUNTER — Ambulatory Visit: Payer: BLUE CROSS/BLUE SHIELD | Admitting: Cardiology

## 2019-07-14 ENCOUNTER — Encounter: Payer: Self-pay | Admitting: Cardiology
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
## 2019-08-09 ENCOUNTER — Ambulatory Visit: Payer: BLUE CROSS/BLUE SHIELD | Admitting: Cardiology
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
## 2019-08-11 ENCOUNTER — Encounter: Payer: Self-pay | Admitting: Cardiology

## 2019-12-25 ENCOUNTER — Emergency Department (HOSPITAL_COMMUNITY)
Admission: EM | Admit: 2019-12-25 | Discharge: 2019-12-25 | Disposition: A | Discharge status: Home / Self Care | Payer: BLUE CROSS/BLUE SHIELD | Attending: Emergency Medicine | Admitting: Emergency Medicine

## 2019-12-25 ENCOUNTER — Emergency Department (HOSPITAL_COMMUNITY): Payer: BLUE CROSS/BLUE SHIELD

## 2019-12-25 ENCOUNTER — Other Ambulatory Visit: Payer: Self-pay

## 2019-12-25 ENCOUNTER — Encounter (HOSPITAL_COMMUNITY): Payer: Self-pay | Admitting: Emergency Medicine

## 2019-12-25 DIAGNOSIS — M542 Cervicalgia: Secondary | ICD-10-CM | POA: Insufficient documentation

## 2019-12-25 DIAGNOSIS — M25512 Pain in left shoulder: Secondary | ICD-10-CM | POA: Insufficient documentation

## 2019-12-25 DIAGNOSIS — M25511 Pain in right shoulder: Secondary | ICD-10-CM | POA: Diagnosis not present

## 2019-12-25 DIAGNOSIS — Y999 Unspecified external cause status: Secondary | ICD-10-CM | POA: Diagnosis not present

## 2019-12-25 DIAGNOSIS — M79601 Pain in right arm: Secondary | ICD-10-CM | POA: Diagnosis not present

## 2019-12-25 DIAGNOSIS — Z5321 Procedure and treatment not carried out due to patient leaving prior to being seen by health care provider: Secondary | ICD-10-CM | POA: Diagnosis not present

## 2019-12-25 DIAGNOSIS — Y9289 Other specified places as the place of occurrence of the external cause: Secondary | ICD-10-CM | POA: Diagnosis not present

## 2019-12-25 DIAGNOSIS — Y939 Activity, unspecified: Secondary | ICD-10-CM | POA: Diagnosis not present
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
## 2020-01-31 ENCOUNTER — Ambulatory Visit: Payer: BLUE CROSS/BLUE SHIELD | Admitting: Student
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
## 2020-02-06 ENCOUNTER — Ambulatory Visit: Payer: BLUE CROSS/BLUE SHIELD | Admitting: Cardiology

## 2020-02-07 ENCOUNTER — Other Ambulatory Visit: Payer: Self-pay

## 2020-02-07 ENCOUNTER — Ambulatory Visit: Payer: BLUE CROSS/BLUE SHIELD | Admitting: Cardiology

## 2020-02-07 ENCOUNTER — Other Ambulatory Visit (HOSPITAL_COMMUNITY)
Admission: RE | Admit: 2020-02-07 | Discharge: 2020-02-07 | Disposition: A | Discharge status: Home / Self Care | Payer: BLUE CROSS/BLUE SHIELD | Source: Ambulatory Visit | Attending: Cardiology | Admitting: Cardiology

## 2020-02-07 ENCOUNTER — Encounter: Payer: Self-pay | Admitting: Cardiology

## 2020-02-07 VITALS — BP 150/88 | HR 89 | Ht 71.0 in | Wt 200.0 lb

## 2020-02-07 DIAGNOSIS — I2510 Atherosclerotic heart disease of native coronary artery without angina pectoris: Secondary | ICD-10-CM

## 2020-02-07 DIAGNOSIS — Z01818 Encounter for other preprocedural examination: Secondary | ICD-10-CM | POA: Diagnosis not present

## 2020-02-07 DIAGNOSIS — I208 Other forms of angina pectoris: Secondary | ICD-10-CM | POA: Diagnosis not present

## 2020-02-07 DIAGNOSIS — E785 Hyperlipidemia, unspecified: Secondary | ICD-10-CM | POA: Insufficient documentation

## 2020-02-07 DIAGNOSIS — Z9861 Coronary angioplasty status: Secondary | ICD-10-CM

## 2020-02-07 LAB — CBC
HCT: 52.6 % — ABNORMAL HIGH (ref 39.0–52.0)
Hemoglobin: 18.1 g/dL — ABNORMAL HIGH (ref 13.0–17.0)
MCH: 30.5 pg (ref 26.0–34.0)
MCHC: 34.4 g/dL (ref 30.0–36.0)
MCV: 88.6 fL (ref 80.0–100.0)
Platelets: 256 10*3/uL (ref 150–400)
RBC: 5.94 MIL/uL — ABNORMAL HIGH (ref 4.22–5.81)
RDW: 13.2 % (ref 11.5–15.5)
WBC: 9.5 10*3/uL (ref 4.0–10.5)
nRBC: 0 % (ref 0.0–0.2)

## 2020-02-07 LAB — BASIC METABOLIC PANEL
Anion gap: 7 (ref 5–15)
BUN: 8 mg/dL (ref 6–20)
CO2: 25 mmol/L (ref 22–32)
Calcium: 9.2 mg/dL (ref 8.9–10.3)
Chloride: 103 mmol/L (ref 98–111)
Creatinine, Ser: 0.85 mg/dL (ref 0.61–1.24)
GFR, Estimated: >60 mL/min (ref >60)
Glucose, Bld: 103 mg/dL — ABNORMAL HIGH (ref 70–99)
Potassium: 4 mmol/L (ref 3.5–5.1)
Sodium: 135 mmol/L (ref 135–145)

## 2020-02-07 LAB — LIPID PANEL
Cholesterol: 238 mg/dL — ABNORMAL HIGH (ref 0–200)
HDL: 46 mg/dL (ref >40)
LDL Cholesterol: 164 mg/dL — ABNORMAL HIGH (ref 0–99)
Total CHOL/HDL Ratio: 5.2 RATIO
Triglycerides: 139 mg/dL (ref <150)
VLDL: 28 mg/dL (ref 0–40)

## 2020-02-07 MED ORDER — NITROGLYCERIN 0.4 MG SL SUBL: 0.4000 mg | SUBLINGUAL_TABLET | SUBLINGUAL | 3 refills | Status: AC | PRN

## 2020-02-07 MED ORDER — ATORVASTATIN CALCIUM 40 MG PO TABS: 40.0000 mg | ORAL_TABLET | Freq: Every day | ORAL | 3 refills | Status: AC

## 2020-02-07 MED ORDER — ISOSORBIDE MONONITRATE ER 30 MG PO TB24: 30.0000 mg | ORAL_TABLET | Freq: Every day | ORAL | 5 refills | Status: DC

## 2020-02-07 MED ORDER — LISINOPRIL 20 MG PO TABS: 20.0000 mg | ORAL_TABLET | Freq: Every day | ORAL | 3 refills | Status: DC

## 2020-02-07 MED ORDER — CARVEDILOL 3.125 MG PO TABS
3.1250 mg | ORAL_TABLET | Freq: Two times a day (BID) | ORAL | 3 refills | Status: DC
Start: 2020-02-07 — End: 2021-01-21

## 2020-02-07 NOTE — Assessment & Plan Note (Signed)
RPDA PCI with DES 2017- patent Jan 2020 with moderate residual OM and Dx disease
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
## 2020-02-07 NOTE — Assessment & Plan Note (Signed)
He is on statin Rx- check lipids.  I wonder if his leg pain may be statin myalgia.  Consider further evaluation of this after his cath.

## 2020-02-07 NOTE — Patient Instructions (Addendum)
Medication Instructions:     Your physician recommends that you continue on your current medications as directed. Please refer to the Current Medication list given to you today.  Start Imdur 30 mg Daily   *If you need a refill on your cardiac medications before your next appointment, please call your pharmacy*   Lab Work: Your physician recommends that you return for lab work in: Today   If you have labs (blood work) drawn today and your tests are completely normal, you will receive your results only by: Marland Kitchen MyChart Message (if you have MyChart) OR . A paper copy in the mail If you have any lab test that is abnormal or we need to change your treatment, we will call you to review the results.   Testing/Procedures: Your physician has requested that you have a cardiac catheterization. Cardiac catheterization is used to diagnose and/or treat various heart conditions. Doctors may recommend this procedure for a number of different reasons. The most common reason is to evaluate chest pain. Chest pain can be a symptom of coronary artery disease (CAD), and cardiac catheterization can show whether plaque is narrowing or blocking your heart's arteries. This procedure is also used to evaluate the valves, as well as measure the blood flow and oxygen levels in different parts of your heart. For further information please visit [email or web address] Please follow instruction sheet, as given.   Follow-Up: At [facility], you and your health needs are our priority.  As part of our continuing mission to provide you with exceptional heart care, we have created designated Provider Care Teams.  These Care Teams include your primary Cardiologist (physician) and Advanced Practice Providers (APPs -  Physician Assistants and Nurse Practitioners) who all work together to provide you with the care you need, when you need it.  We recommend signing up for the patient portal called "MyChart".  Sign up information is  provided on this After Visit Summary.  MyChart is used to connect with patients for Virtual Visits (Telemedicine).  Patients are able to view lab/test results, encounter notes, upcoming appointments, etc.  Non-urgent messages can be sent to your provider as well.   To learn more about what you can do with MyChart, go to [email or web address].    Your next appointment:    After Cath  The format for your next appointment:   In Person  Provider:   Randall An, PA-C or Wesley Reedy, PA-C   Other Instructions Thank you for choosing [facility]!     [facility] [address] [place] [address] Dept: [phone] Loc: [phone]  Wesley Newman  02/07/2020  You are scheduled for a Cardiac Catheterization on Friday, October 29 with Dr. Nicki Newman.  1. Please arrive at the [facility] (Main Entrance A) at [facility]: [address] [place], [place] [address] at 8:30 AM (This time is two hours before your procedure to ensure your preparation). Free valet parking service is available.   Special note: Every effort is made to have your procedure done on time. Please understand that emergencies sometimes delay scheduled procedures.  2. Diet: Do not eat solid foods after midnight.  The patient may have clear liquids until 5am upon the day of the procedure.  3. Labs: You will need to have blood drawn on Tuesday, October 26 at [facility] [address], [place]  Open: 7am - 6pm, Sat 8am - 12 noon  Phone: [phone]. You do not need to be fasting.  4. Medication instructions in preparation for your procedure:   Contrast Allergy: No  On the morning of your procedure, take your Aspirin and any morning medicines NOT listed above.  You may use sips of water.  5. Plan for one night stay--bring personal belongings. 6. Bring a current list of your medications and current  insurance cards. 7. You MUST have a responsible person to drive you home. 8. Someone MUST be with you the first 24 hours after you arrive home or your discharge will be delayed. 9. Please wear clothes that are easy to get on and off and wear slip-on shoes.  Thank you for allowing [place] to care for you!   -- [name]
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
## 2020-02-07 NOTE — Assessment & Plan Note (Signed)
1 PPD smoker 

## 2020-02-07 NOTE — Assessment & Plan Note (Signed)
Classic exertional angina for the past 3 weeks- responsive to NTG and similar to his pre PCI symptoms

## 2020-02-07 NOTE — Assessment & Plan Note (Signed)
Resume Coreg- repeat B/P by me 132/90

## 2020-02-08 ENCOUNTER — Other Ambulatory Visit (HOSPITAL_COMMUNITY)
Admission: RE | Admit: 2020-02-08 | Discharge: 2020-02-08 | Disposition: A | Discharge status: Home / Self Care | Payer: BLUE CROSS/BLUE SHIELD | Source: Ambulatory Visit | Attending: Cardiovascular Disease | Admitting: Cardiovascular Disease

## 2020-02-08 DIAGNOSIS — Z20822 Contact with and (suspected) exposure to covid-19: Secondary | ICD-10-CM | POA: Insufficient documentation

## 2020-02-08 DIAGNOSIS — Z01812 Encounter for preprocedural laboratory examination: Secondary | ICD-10-CM | POA: Insufficient documentation

## 2020-02-08 LAB — SARS CORONAVIRUS 2 (TAT 6-24 HRS): SARS Coronavirus 2: NEGATIVE

## 2020-02-09 ENCOUNTER — Telehealth: Payer: Self-pay | Admitting: *Deleted

## 2020-02-09 NOTE — Telephone Encounter (Signed)
Pt contacted pre-catheterization scheduled at [facility] for: Friday February 10, 2020 10:30 AM Verified arrival time and place: [facility] Main Entrance [facility]) at: 8:30 AM   No solid food after midnight prior to cath, clear liquids until 5 AM day of procedure.   AM meds can be  taken pre-cath with sips of water including: ASA 81 mg   Confirmed patient has responsible adult to drive home post procedure and be with patient first 24 hours after arriving home: yes  You are allowed ONE visitor in the waiting room during the time you are at the hospital for your procedure. Both you and your visitor must wear a mask once you enter the hospital.       COVID-19 Pre-Screening Questions:   In the past 14 days have you had a new cough, new headache, new nasal congestion, fever (100.4 or greater) unexplained body aches, new sore throat, or sudden loss of taste or sense of smell? no  In the past 14 days have you been around anyone with known Covid 19? no  Have you been vaccinated for COVID-19? no  Reviewed procedure/mask/visitor instructions, COVID-19 questions with patient.

## 2020-02-10 ENCOUNTER — Encounter (HOSPITAL_COMMUNITY)
Admission: RE | Disposition: A | Discharge status: Home / Self Care | Payer: Self-pay | Source: Home / Self Care | Attending: Cardiovascular Disease

## 2020-02-10 ENCOUNTER — Ambulatory Visit (HOSPITAL_COMMUNITY)
Admission: RE | Admit: 2020-02-10 | Discharge: 2020-02-10 | Disposition: A | Discharge status: Home / Self Care | Payer: BLUE CROSS/BLUE SHIELD | Attending: Cardiovascular Disease | Admitting: Cardiovascular Disease

## 2020-02-10 ENCOUNTER — Other Ambulatory Visit: Payer: Self-pay

## 2020-02-10 ENCOUNTER — Other Ambulatory Visit: Payer: Self-pay | Admitting: Cardiology

## 2020-02-10 DIAGNOSIS — I25118 Atherosclerotic heart disease of native coronary artery with other forms of angina pectoris: Secondary | ICD-10-CM | POA: Diagnosis present

## 2020-02-10 DIAGNOSIS — Z955 Presence of coronary angioplasty implant and graft: Secondary | ICD-10-CM | POA: Insufficient documentation

## 2020-02-10 DIAGNOSIS — I10 Essential (primary) hypertension: Secondary | ICD-10-CM | POA: Diagnosis not present

## 2020-02-10 DIAGNOSIS — I25119 Atherosclerotic heart disease of native coronary artery with unspecified angina pectoris: Secondary | ICD-10-CM | POA: Diagnosis not present

## 2020-02-10 DIAGNOSIS — I209 Angina pectoris, unspecified: Secondary | ICD-10-CM

## 2020-02-10 DIAGNOSIS — F17210 Nicotine dependence, cigarettes, uncomplicated: Secondary | ICD-10-CM | POA: Diagnosis not present

## 2020-02-10 DIAGNOSIS — E785 Hyperlipidemia, unspecified: Secondary | ICD-10-CM | POA: Diagnosis not present

## 2020-02-10 HISTORY — PX: LEFT HEART CATH AND CORONARY ANGIOGRAPHY: CATH118249

## 2020-02-10 SURGERY — LEFT HEART CATH AND CORONARY ANGIOGRAPHY
Anesthesia: LOCAL

## 2020-02-10 MED ORDER — FENTANYL CITRATE (PF) 100 MCG/2ML IJ SOLN
INTRAMUSCULAR | Status: AC
  Filled 2020-02-10: qty 2

## 2020-02-10 MED ORDER — IOHEXOL 350 MG/ML SOLN
INTRAVENOUS | Status: DC | PRN
  Administered 2020-02-10: 25 mL via INTRACARDIAC

## 2020-02-10 MED ORDER — VERAPAMIL HCL 2.5 MG/ML IV SOLN
INTRAVENOUS | Status: AC
  Filled 2020-02-10: qty 2

## 2020-02-10 MED ORDER — HEPARIN SODIUM (PORCINE) 1000 UNIT/ML IJ SOLN
INTRAMUSCULAR | Status: AC
  Filled 2020-02-10: qty 1

## 2020-02-10 MED ORDER — LIDOCAINE HCL (PF) 1 % IJ SOLN
INTRAMUSCULAR | Status: DC | PRN
  Administered 2020-02-10: 2 mL

## 2020-02-10 MED ORDER — MIDAZOLAM HCL 2 MG/2ML IJ SOLN
INTRAMUSCULAR | Status: AC
  Filled 2020-02-10: qty 2

## 2020-02-10 MED ORDER — SODIUM CHLORIDE 0.9 % WEIGHT BASED INFUSION: 1.0000 mL/kg/h | INTRAVENOUS | Status: DC

## 2020-02-10 MED ORDER — AMLODIPINE BESYLATE 5 MG PO TABS: 5.0000 mg | ORAL_TABLET | Freq: Every day | ORAL | 3 refills | Status: DC

## 2020-02-10 MED ORDER — LIDOCAINE HCL (PF) 1 % IJ SOLN
INTRAMUSCULAR | Status: AC
  Filled 2020-02-10: qty 30

## 2020-02-10 MED ORDER — HEPARIN SODIUM (PORCINE) 1000 UNIT/ML IJ SOLN
INTRAMUSCULAR | Status: DC | PRN
  Administered 2020-02-10: 4500 [IU] via INTRAVENOUS

## 2020-02-10 MED ORDER — SODIUM CHLORIDE 0.9 % WEIGHT BASED INFUSION
3.0000 mL/kg/h | INTRAVENOUS | Status: AC
  Administered 2020-02-10: 3 mL/kg/h via INTRAVENOUS

## 2020-02-10 MED ORDER — HEPARIN (PORCINE) IN NACL 1000-0.9 UT/500ML-% IV SOLN
INTRAVENOUS | Status: AC
  Filled 2020-02-10: qty 1000

## 2020-02-10 MED ORDER — MIDAZOLAM HCL 2 MG/2ML IJ SOLN
INTRAMUSCULAR | Status: DC | PRN
  Administered 2020-02-10: 2 mg via INTRAVENOUS
  Administered 2020-02-10: 1 mg via INTRAVENOUS

## 2020-02-10 MED ORDER — SODIUM CHLORIDE 0.9% FLUSH: 3.0000 mL | INTRAVENOUS | Status: DC | PRN

## 2020-02-10 MED ORDER — HEPARIN (PORCINE) IN NACL 1000-0.9 UT/500ML-% IV SOLN
INTRAVENOUS | Status: DC | PRN
  Administered 2020-02-10 (×2): 500 mL

## 2020-02-10 MED ORDER — VERAPAMIL HCL 2.5 MG/ML IV SOLN
INTRAVENOUS | Status: DC | PRN
  Administered 2020-02-10: 5 mL via INTRA_ARTERIAL
  Administered 2020-02-10: 10 mL via INTRA_ARTERIAL

## 2020-02-10 MED ORDER — ASPIRIN 81 MG PO CHEW: 81.0000 mg | CHEWABLE_TABLET | ORAL | Status: DC

## 2020-02-10 MED ORDER — FENTANYL CITRATE (PF) 100 MCG/2ML IJ SOLN
INTRAMUSCULAR | Status: DC | PRN
  Administered 2020-02-10: 50 ug via INTRAVENOUS
  Administered 2020-02-10: 25 ug via INTRAVENOUS

## 2020-02-10 SURGICAL SUPPLY — 12 items
CATH INFINITI 5 FR JL3.5 (CATHETERS) ×2 IMPLANT
CATH INFINITI JR4 5F (CATHETERS) ×2 IMPLANT
CATH OPTITORQUE TIG 4.0 5F (CATHETERS) ×2 IMPLANT
DEVICE RAD COMP TR BAND LRG (VASCULAR PRODUCTS) ×2 IMPLANT
GLIDESHEATH SLEND SS 6F .021 (SHEATH) ×2 IMPLANT
GUIDEWIRE INQWIRE 1.5J.035X260 (WIRE) ×1 IMPLANT
INQWIRE 1.5J .035X260CM (WIRE) ×2
KIT HEART LEFT (KITS) ×2 IMPLANT
PACK CARDIAC CATHETERIZATION (CUSTOM PROCEDURE TRAY) ×2 IMPLANT
SHEATH PROBE COVER 6X72 (BAG) ×2 IMPLANT
TRANSDUCER W/STOPCOCK (MISCELLANEOUS) ×2 IMPLANT
TUBING CIL FLEX 10 FLL-RA (TUBING) ×2 IMPLANT

## 2020-02-10 NOTE — Discharge Instructions (Signed)
Radial Site Care  This sheet gives you information about how to care for yourself after your procedure. Your health care provider may also give you more specific instructions. If you have problems or questions, contact your health care provider. What can I expect after the procedure? After the procedure, it is common to have:  Bruising and tenderness at the catheter insertion area. Follow these instructions at home: Medicines  Take over-the-counter and prescription medicines only as told by your health care provider. Insertion site care  Follow instructions from your health care provider about how to take care of your insertion site. Make sure you: ? Wash your hands with soap and water before you change your bandage (dressing). If soap and water are not available, use hand sanitizer. ? Change your dressing as told by your health care provider. ? Leave stitches (sutures), skin glue, or adhesive strips in place. These skin closures may need to stay in place for 2 weeks or longer. If adhesive strip edges start to loosen and curl up, you may trim the loose edges. Do not remove adhesive strips completely unless your health care provider tells you to do that.  Check your insertion site every day for signs of infection. Check for: ? Redness, swelling, or pain. ? Fluid or blood. ? Pus or a bad smell. ? Warmth.  Do not take baths, swim, or use a hot tub until your health care provider approves.  You may shower 24-48 hours after the procedure, or as directed by your health care provider. ? Remove the dressing and gently wash the site with plain soap and water. ? Pat the area dry with a clean towel. ? Do not rub the site. That could cause bleeding.  Do not apply powder or lotion to the site. Activity   For 24 hours after the procedure, or as directed by your health care provider: ? Do not flex or bend the affected arm. ? Do not push or pull heavy objects with the affected arm. ? Do not  drive yourself home from the hospital or clinic. You may drive 24 hours after the procedure unless your health care provider tells you not to. ? Do not operate machinery or power tools.  Do not lift anything that is heavier than 10 lb (4.5 kg), or the limit that you are told, until your health care provider says that it is safe.  Ask your health care provider when it is okay to: ? Return to work or school. ? Resume usual physical activities or sports. ? Resume sexual activity. General instructions  If the catheter site starts to bleed, raise your arm and put firm pressure on the site. If the bleeding does not stop, get help right away. This is a medical emergency.  If you went home on the same day as your procedure, a responsible adult should be with you for the first 24 hours after you arrive home.  Keep all follow-up visits as told by your health care provider. This is important. Contact a health care provider if:  You have a fever.  You have redness, swelling, or yellow drainage around your insertion site. Get help right away if:  You have unusual pain at the radial site.  The catheter insertion area swells very fast.  The insertion area is bleeding, and the bleeding does not stop when you hold steady pressure on the area.  Your arm or hand becomes pale, cool, tingly, or numb. These symptoms may represent a serious problem   that is an emergency. Do not wait to see if the symptoms will go away. Get medical help right away. Call your local emergency services (911 in the [place] Do not drive yourself to the hospital. Summary  After the procedure, it is common to have bruising and tenderness at the site.  Follow instructions from your health care provider about how to take care of your radial site wound. Check the wound every day for signs of infection.  Do not lift anything that is heavier than 10 lb (4.5 kg), or the limit that you are told, until your health care provider says  that it is safe. This information is not intended to replace advice given to you by your health care provider. Make sure you discuss any questions you have with your health care provider. Document Revised: 05/06/2017 Document Reviewed: 05/06/2017 Elsevier Patient Education  2020 Elsevier Inc.  

## 2020-02-10 NOTE — Interval H&P Note (Signed)
Cath Lab Visit (complete for each Cath Lab visit)  Clinical Evaluation Leading to the Procedure:   ACS: No.  Non-ACS:    Anginal Classification: CCS III  Anti-ischemic medical therapy: Maximal Therapy (2 or more classes of medications)  Non-Invasive Test Results: No non-invasive testing performed  Prior CABG: No previous CABG      History and Physical Interval Note:  02/10/2020 2:37 PM  Wesley Newman  has presented today for surgery, with the diagnosis of accelerating angina.  The various methods of treatment have been discussed with the patient and family. After consideration of risks, benefits and other options for treatment, the patient has consented to  Procedure(s): LEFT HEART CATH AND CORONARY ANGIOGRAPHY (N/A) as a surgical intervention.  The patient's history has been reviewed, patient examined, no change in status, stable for surgery.  I have reviewed the patient's chart and labs.  Questions were answered to the patient's satisfaction.     Nicki Guadalajara

## 2020-02-13 ENCOUNTER — Encounter (HOSPITAL_COMMUNITY): Payer: Self-pay | Admitting: Cardiovascular Disease

## 2020-02-13 ENCOUNTER — Telehealth: Payer: Self-pay | Admitting: *Deleted

## 2020-02-13 NOTE — Telephone Encounter (Signed)
Post cath appointment scheduled with Randall An, PA 02/24/20 2 PM in the [employer].  Call placed to patient to let him know about this appointment, no answer.

## 2020-02-14 NOTE — Telephone Encounter (Addendum)
Call placed to patient-he is aware that appointment has been scheduled for him with Randall An, PA in the [place] Office 02/24/20 2 PM.

## 2020-02-22 ENCOUNTER — Telehealth: Payer: Self-pay | Admitting: Cardiology

## 2020-02-22 NOTE — Telephone Encounter (Signed)
Spoke with pt's wife Asher Muir who reports that pt has had chest pain X4 since his cath and had to take nitro. He just started taking the imdur. He has only taken nitro one time since then. Wife instructed to continue all meds until visit on 02/24/20. Please advise.

## 2020-02-22 NOTE — Telephone Encounter (Signed)
    I have not met this patient. Would recommend continuing his current medication regimen until the time of his visit on 11/12. Would be great if they can check his BP at home in the interim if able. If not, we will check at the time of his visit and can make adjustments at that time if needed.   Signed, Erma Heritage, PA-C 02/22/2020, 4:25 PM Pager: [phone]

## 2020-02-22 NOTE — Telephone Encounter (Signed)
Patient wife wants to know if Wesley Newman is suppose to be taking the Norvasc 5mg  along with all the other blood pressure medications. Also has a question about the imdur.

## 2020-02-23 NOTE — Telephone Encounter (Signed)
Pt notified and voiced understanding 

## 2020-02-24 ENCOUNTER — Ambulatory Visit: Payer: BLUE CROSS/BLUE SHIELD | Admitting: Student
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
## 2020-02-27 ENCOUNTER — Ambulatory Visit: Payer: BLUE CROSS/BLUE SHIELD | Admitting: General Practice

## 2020-02-27 ENCOUNTER — Telehealth: Payer: Self-pay | Admitting: Cardiology

## 2020-02-27 NOTE — Telephone Encounter (Signed)
Returned call to [place]. No answer. Unable to leave [employer] not being setup.

## 2020-02-27 NOTE — Telephone Encounter (Signed)
New message     Patient called wants to know if you will call him something in for indigestion He has been having a lot of indigestion from the medications he is taking ?
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
## 2020-03-01 NOTE — Telephone Encounter (Signed)
Unable to reach patient. He has an apt tomorrow 03/02/20 with J.Cleaver, NP. He can discuss with provider at that time.

## 2020-03-02 ENCOUNTER — Ambulatory Visit: Payer: BLUE CROSS/BLUE SHIELD | Admitting: General Practice

## 2020-03-10 ENCOUNTER — Other Ambulatory Visit: Payer: Self-pay | Admitting: Physician Assistant

## 2020-03-10 DIAGNOSIS — I2510 Atherosclerotic heart disease of native coronary artery without angina pectoris: Secondary | ICD-10-CM

## 2020-03-10 MED ORDER — NITROGLYCERIN 0.4 MG SL SUBL: 0.4000 mg | SUBLINGUAL_TABLET | SUBLINGUAL | 0 refills | Status: AC | PRN

## 2020-03-10 NOTE — Progress Notes (Signed)
Pt called stating he is on vacation in [place] and lost his nitro. He did have an episode of chest pain last evening. I attempted to talk to him about his CP to see if he needs to be evaluated in an ER. He denied needing to see someone, just wants his nitro. When asked how often he is taking nitro he responded "when I need it."  Does not look like he had a TOC following recent heart cath. He has canceled the last three office visit. Appt with Randall An 12/8. I refilled his nitro for 25 tablets.
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
## 2020-03-21 ENCOUNTER — Ambulatory Visit: Payer: BLUE CROSS/BLUE SHIELD | Admitting: Student

## 2020-03-22 ENCOUNTER — Telehealth: Payer: Self-pay | Admitting: Cardiology

## 2020-03-22 NOTE — Telephone Encounter (Signed)
Pt's wife called stating that the pt is having indigestion from all of the medication he's taking and is needing a Rx for something to help him.

## 2020-03-23 NOTE — Telephone Encounter (Signed)
03/23/20 LMTCB

## 2020-03-23 NOTE — Telephone Encounter (Signed)
12/10 Spoke with patient, recommended he use OTC PPIs, and discuss at upcoming visit in January.

## 2020-03-28 ENCOUNTER — Telehealth: Payer: Self-pay | Admitting: Cardiology

## 2020-03-28 NOTE — Telephone Encounter (Signed)
New message     [facility] [place]   Patient sister wants you to call her about calling in medication for indigestion

## 2020-03-28 NOTE — Telephone Encounter (Signed)
Spoke with sister. Explained that pt has missed 4 appt and that medication can not be sent in until seen by provider. Sister voiced understanding.
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
## 2020-04-30 ENCOUNTER — Ambulatory Visit: Payer: BLUE CROSS/BLUE SHIELD | Admitting: Physician Assistant

## 2020-04-30 ENCOUNTER — Other Ambulatory Visit: Payer: Self-pay

## 2020-04-30 DIAGNOSIS — E782 Mixed hyperlipidemia: Secondary | ICD-10-CM

## 2020-04-30 DIAGNOSIS — I2510 Atherosclerotic heart disease of native coronary artery without angina pectoris: Secondary | ICD-10-CM

## 2020-04-30 DIAGNOSIS — I10 Essential (primary) hypertension: Secondary | ICD-10-CM

## 2020-04-30 DIAGNOSIS — Z720 Tobacco use: Secondary | ICD-10-CM

## 2020-12-19 ENCOUNTER — Ambulatory Visit: Payer: BLUE CROSS/BLUE SHIELD | Admitting: Family Medicine
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
## 2020-12-24 ENCOUNTER — Telehealth: Payer: Self-pay

## 2020-12-24 ENCOUNTER — Ambulatory Visit: Payer: BLUE CROSS/BLUE SHIELD | Admitting: Family Medicine

## 2020-12-24 ENCOUNTER — Encounter: Payer: Self-pay | Admitting: Family Medicine

## 2020-12-24 VITALS — BP 140/92 | HR 79 | Ht 71.0 in | Wt 210.0 lb

## 2020-12-24 DIAGNOSIS — I10 Essential (primary) hypertension: Secondary | ICD-10-CM | POA: Diagnosis not present

## 2020-12-24 DIAGNOSIS — Z720 Tobacco use: Secondary | ICD-10-CM

## 2020-12-24 DIAGNOSIS — R0602 Shortness of breath: Secondary | ICD-10-CM

## 2020-12-24 DIAGNOSIS — E785 Hyperlipidemia, unspecified: Secondary | ICD-10-CM | POA: Diagnosis not present

## 2020-12-24 DIAGNOSIS — M79606 Pain in leg, unspecified: Secondary | ICD-10-CM

## 2020-12-24 DIAGNOSIS — R079 Chest pain, unspecified: Secondary | ICD-10-CM

## 2020-12-24 DIAGNOSIS — I2510 Atherosclerotic heart disease of native coronary artery without angina pectoris: Secondary | ICD-10-CM

## 2020-12-24 DIAGNOSIS — I208 Other forms of angina pectoris: Secondary | ICD-10-CM | POA: Diagnosis not present

## 2020-12-24 DIAGNOSIS — K219 Gastro-esophageal reflux disease without esophagitis: Secondary | ICD-10-CM

## 2020-12-24 MED ORDER — LISINOPRIL 40 MG PO TABS: 40.0000 mg | ORAL_TABLET | Freq: Every day | ORAL | 3 refills | Status: AC

## 2020-12-24 MED ORDER — PANTOPRAZOLE SODIUM 20 MG PO TBEC: 20.0000 mg | DELAYED_RELEASE_TABLET | Freq: Every day | ORAL | 3 refills | Status: AC

## 2020-12-24 NOTE — Patient Instructions (Addendum)
Medication Instructions:  Your physician has recommended you make the following change in your medication:  START Protonix 20 mg tablets daily INCREASE Lisinopril to 40 mg tablets daily  *If you need a refill on your cardiac medications before your next appointment, please call your pharmacy*   Lab Work: FLT LFP If you have labs (blood work) drawn today and your tests are completely normal, you will receive your results only by: MyChart Message (if you have MyChart) OR A paper copy in the mail If you have any lab test that is abnormal or we need to change your treatment, we will call you to review the results.   Testing/Procedures: Your physician has requested that you have an echocardiogram. Echocardiography is a painless test that uses sound waves to create images of your heart. It provides your doctor with information about the size and shape of your heart and how well your heart's chambers and valves are working. This procedure takes approximately one hour. There are no restrictions for this procedure.  Your physician has requested that you have a lexiscan myoview. For further information please visit [email or web address] Please follow instruction sheet, as given.  Your physician has requested that you have an ankle brachial index (ABI). During this test an ultrasound and blood pressure cuff are used to evaluate the arteries that supply the arms and legs with blood. Allow thirty minutes for this exam. There are no restrictions or special instructions.    Follow-Up: At [facility], you and your health needs are our priority.  As part of our continuing mission to provide you with exceptional heart care, we have created designated Provider Care Teams.  These Care Teams include your primary Cardiologist (physician) and Advanced Practice Providers (APPs -  Physician Assistants and Nurse Practitioners) who all work together to provide you with the care you need, when you need it.  We  recommend signing up for the patient portal called "MyChart".  Sign up information is provided on this After Visit Summary.  MyChart is used to connect with patients for Virtual Visits (Telemedicine).  Patients are able to view lab/test results, encounter notes, upcoming appointments, etc.  Non-urgent messages can be sent to your provider as well.   To learn more about what you can do with MyChart, go to [email or web address].    Your next appointment:   6-8 week(s) after testing  The format for your next appointment:   In Person  Provider:   Nena Polio, NP   Other Instructions

## 2020-12-24 NOTE — Telephone Encounter (Signed)
Prior Authorization for Protonix 20 mg tablets approved  Effective from 12/24/2020 through 12/23/2021.
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
## 2021-01-17 ENCOUNTER — Telehealth: Payer: Self-pay | Admitting: Cardiology

## 2021-01-17 ENCOUNTER — Encounter: Payer: Self-pay | Admitting: Internal Medicine

## 2021-01-17 NOTE — Telephone Encounter (Signed)
Pt had to cancel stress test due to ins auth pending.   Wife called wanting to cancel stress test anyway's because pt has been having more pain and burning in his chest and is requesting he have a cath done.   Please call [phone]

## 2021-01-18 ENCOUNTER — Encounter (HOSPITAL_COMMUNITY): Payer: BLUE CROSS/BLUE SHIELD

## 2021-01-18 ENCOUNTER — Ambulatory Visit (HOSPITAL_COMMUNITY): Admission: RE | Admit: 2021-01-18 | Payer: BLUE CROSS/BLUE SHIELD | Source: Ambulatory Visit

## 2021-01-21 ENCOUNTER — Telehealth: Payer: Self-pay | Admitting: Cardiology

## 2021-01-21 ENCOUNTER — Telehealth: Payer: Self-pay | Admitting: Family Medicine

## 2021-01-21 MED ORDER — ISOSORBIDE MONONITRATE ER 30 MG PO TB24: 15.0000 mg | ORAL_TABLET | Freq: Every day | ORAL | 1 refills | Status: AC

## 2021-01-21 MED ORDER — CARVEDILOL 6.25 MG PO TABS: 6.2500 mg | ORAL_TABLET | Freq: Two times a day (BID) | ORAL | 1 refills | Status: AC

## 2021-01-21 NOTE — Telephone Encounter (Signed)
See previous notes for details.

## 2021-01-21 NOTE — Telephone Encounter (Signed)
Patient informed and verbalized understanding of plan. 

## 2021-01-21 NOTE — Telephone Encounter (Signed)
Patient called back they are out of network- you have permission to speak with his wife.  They need a referral for [facility] cardiology and they want all orders pertaining to test send to [facility] cardiology because the patient is in pain and they think he needs a heart cath .    They are going to stop in office at 8am 10/10 to sign release of information and DPR  .

## 2021-01-21 NOTE — Telephone Encounter (Signed)
New message     Wife called , send order for Stress test and other testing to [facility] in [place] [address] -[address]   Wife notified that we will need release signed

## 2021-01-21 NOTE — Telephone Encounter (Signed)
See previous phone note for details No DPR on file for wife Patient aware that it need to completed

## 2021-02-08 ENCOUNTER — Ambulatory Visit: Payer: BLUE CROSS/BLUE SHIELD | Admitting: Family Medicine
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
## 2021-06-10 ENCOUNTER — Encounter: Payer: Self-pay | Admitting: Gastroenterology

## 2021-06-10 ENCOUNTER — Ambulatory Visit: Payer: BLUE CROSS/BLUE SHIELD | Admitting: Gastroenterology

## 2021-12-06 ENCOUNTER — Encounter (HOSPITAL_COMMUNITY): Payer: Self-pay | Admitting: Emergency Medicine

## 2021-12-06 ENCOUNTER — Emergency Department (HOSPITAL_COMMUNITY)
Admission: EM | Admit: 2021-12-06 | Discharge: 2021-12-06 | Disposition: A | Discharge status: Home / Self Care | Payer: BLUE CROSS/BLUE SHIELD | Attending: Emergency Medicine | Admitting: Emergency Medicine

## 2021-12-06 ENCOUNTER — Other Ambulatory Visit: Payer: Self-pay

## 2021-12-06 DIAGNOSIS — K649 Unspecified hemorrhoids: Secondary | ICD-10-CM

## 2021-12-06 DIAGNOSIS — Z7982 Long term (current) use of aspirin: Secondary | ICD-10-CM | POA: Diagnosis not present

## 2021-12-06 DIAGNOSIS — K648 Other hemorrhoids: Secondary | ICD-10-CM | POA: Insufficient documentation

## 2021-12-06 DIAGNOSIS — Z79899 Other long term (current) drug therapy: Secondary | ICD-10-CM | POA: Insufficient documentation

## 2021-12-06 DIAGNOSIS — I10 Essential (primary) hypertension: Secondary | ICD-10-CM | POA: Diagnosis not present

## 2021-12-06 MED ORDER — OXYCODONE-ACETAMINOPHEN 5-325 MG PO TABS
1.0000 | ORAL_TABLET | Freq: Once | ORAL | Status: AC
  Administered 2021-12-06: 1 via ORAL
  Filled 2021-12-06 (×2): qty 1

## 2021-12-06 MED ORDER — HYDROCORTISONE ACETATE 25 MG RE SUPP: 25.0000 mg | Freq: Two times a day (BID) | RECTAL | 0 refills | Status: DC

## 2021-12-06 NOTE — ED Provider Notes (Addendum)
[facility] EMERGENCY DEPARTMENT Provider Note   CSN: [id number] Arrival date & time: 12/06/21  1038     History Chief Complaint  Patient presents with   Hemorrhoids    Wesley Newman is a 41 y.o. male with h/o HTN, HLD, and hemorrhoids presents to the ED for evaluation of his hemorrhoids. The patient reports that last night he was lifting something heavy and he thinks this flared something up by doing that. The patient reports that he "shoved them back in" but has been having pain. Reports some nausea, but denies any abdominal pin, constipation, or diarrhea. He reports he has some bright red blood with his stools, but have stopped. NKDA. Reports tobacco use, denies any EtOH or drug use.   HPI     Home Medications Prior to Admission medications   Medication Sig Start Date End Date Taking? Authorizing Provider  acetaminophen (TYLENOL) 500 MG tablet Take 1,000 mg by mouth every 6 (six) hours as needed for moderate pain or headache.     [provider]  aspirin 81 MG chewable tablet Chew 81 mg by mouth daily.    [provider]  atorvastatin (LIPITOR) 40 MG tablet Take 1 tablet (40 mg total) by mouth daily. Patient not taking: Reported on 12/24/2020 02/07/20   Abelino Derrick, PA-C  carvedilol (COREG) 6.25 MG tablet Take 1 tablet (6.25 mg total) by mouth 2 (two) times daily. 01/21/21   Jonelle Sidle, MD  isosorbide mononitrate (IMDUR) 30 MG 24 hr tablet Take 0.5 tablets (15 mg total) by mouth daily. 01/21/21   Jonelle Sidle, MD  lisinopril (ZESTRIL) 40 MG tablet Take 1 tablet (40 mg total) by mouth daily. 12/24/20 03/24/21  Netta Neat., NP  nitroGLYCERIN (NITROSTAT) 0.4 MG SL tablet Place 1 tablet (0.4 mg total) under the tongue every 5 (five) minutes as needed for chest pain. Patient not taking: Reported on 12/24/2020 02/07/20   Abelino Derrick, PA-C  nitroGLYCERIN (NITROSTAT) 0.4 MG SL tablet Place 1 tablet (0.4 mg total) under the tongue every 5 (five)  minutes as needed for chest pain. Patient not taking: Reported on 12/24/2020 03/10/20 03/10/21  Marcelino Duster, PA  pantoprazole (PROTONIX) 20 MG tablet Take 1 tablet (20 mg total) by mouth daily. 12/24/20   Netta Neat., NP      Allergies    Patient has no known allergies.    Review of Systems   Review of Systems  Constitutional:  Negative for chills and fever.  Gastrointestinal:  Positive for anal bleeding, nausea and rectal pain. Negative for abdominal pain and vomiting.  Genitourinary:  Negative for dysuria and hematuria.    Physical Exam Updated Vital Signs BP (!) 143/71   Pulse 69   Temp 98.5 F (36.9 C) (Oral)   Resp 17   Ht 5\' 11"  (1.803 m)   Wt 90.7 kg   SpO2 98%   BMI 27.89 kg/m  Physical Exam Vitals and nursing note reviewed. Exam conducted with a chaperone present , Belenda Cruise).  Constitutional:      Appearance: Normal appearance.  Eyes:     General: No scleral icterus. Pulmonary:     Effort: Pulmonary effort is normal. No respiratory distress.  Abdominal:     Palpations: Abdomen is soft.     Tenderness: There is no abdominal tenderness. There is no guarding or rebound.  Genitourinary:    Comments: Several large hemorrhoids seen from 1-4, 5 oclock, 7 oclock, and 11 oclock. Non  thrombosed. No fissures seen. No palpable abscess. Internal hemorrhoid present. Skin:    General: Skin is dry.     Findings: No rash.  Neurological:     General: No focal deficit present.     Mental Status: He is alert. Mental status is at baseline.  Psychiatric:        Mood and Affect: Mood normal.     ED Results / Procedures / Treatments   Labs (all labs ordered are listed, but only abnormal results are displayed) Labs Reviewed - No data to display  EKG None  Radiology No results found.  Procedures Procedures   Medications Ordered in ED Medications  oxyCODONE-acetaminophen (PERCOCET/ROXICET) 5-325 MG per tablet 1 tablet (1 tablet Oral Given 12/06/21 1455)     ED Course/ Medical Decision Making/ A&P                           Medical Decision Making Risk Prescription drug management.   41 y/o M presents to the ED for evaluation of hemorrhoids since last night. Differential diagnosis includes but is not limited to anal prolapse, hemorrhoid, abscess. Vital signs show slightly elevated blood pressure, otherwise normal. Physical exam as noted above. Several large hemorrhoids seen from 1-4, 5 oclock, 7 oclock, and 11 oclock. Non thrombosed. No fissures seen. No palpable abscess. Internal hemorrhoid present.  I do not think this patient needs any incisions right now. Attending at bedside recommends anusol and general surgery referral. He agrees that no incisions need to be made now.   I discussed with the patient about the suppositories, sitz baths, and lidocaine spray. Expressed that he needs to follow up with surgery and call for an appointment. Discussed using Miralax. We discussed strict return precautions and red flag symptoms. The patient verbalized understanding and agrees to the plan.   I discussed this case with my attending physician who cosigned this note including patient's presenting symptoms, physical exam, and planned diagnostics and interventions. Attending physician stated agreement with plan or made changes to plan which were implemented.   Attending physician assessed patient at bedside.  Final Clinical Impression(s) / ED Diagnoses Final diagnoses:  Hemorrhoids, unspecified hemorrhoid type    Rx / DC Orders ED Discharge Orders          Ordered    hydrocortisone (ANUSOL-HC) 25 MG suppository  2 times daily        12/06/21 1607              Achille Rich, PA-C 12/06/21 1627    Achille Rich, PA-C 12/06/21 1646    Bethann Berkshire, MD 12/06/21 1719

## 2021-12-06 NOTE — ED Triage Notes (Signed)
Pt hx of hemorrhoids. States lifted something last night and has had pain since. States was bleeding last night. Pt stated he manually pushed them back in. No bleeding today. Grimacing in triage.

## 2021-12-06 NOTE — Discharge Instructions (Addendum)
You were seen in the ER for evaluation of your hemorrhoids. I have given you some suppositories that can help you with your pain.  You can use witch hazel, Tucks pads.  He can also try some lidocaine spray as well.  I have attached some information about sitz bath's as well.  Ultimately, you will need to follow-up with a surgeon.  I have included information for 1 in this discharge paperwork.  Please make sure to call to schedule an appointment.  If you have any concerns, new or worsening symptoms, please return to the nearest emergency department for evaluation.  Contact a doctor if you: Have pain and swelling that do not get better with treatment or medicine. Have trouble pooping. Cannot poop. Have pain or swelling outside the area of the hemorrhoids. Get help right away if you have: Bleeding that will not stop.

## 2021-12-11 ENCOUNTER — Ambulatory Visit: Payer: BLUE CROSS/BLUE SHIELD | Admitting: General Surgery

## 2021-12-11 ENCOUNTER — Encounter: Payer: Self-pay | Admitting: General Surgery

## 2021-12-11 ENCOUNTER — Telehealth: Payer: Self-pay | Admitting: Family Medicine

## 2021-12-11 VITALS — BP 151/97 | HR 77 | Temp 97.7°F | Resp 14 | Ht 71.0 in | Wt 202.0 lb

## 2021-12-11 DIAGNOSIS — K645 Perianal venous thrombosis: Secondary | ICD-10-CM | POA: Diagnosis not present

## 2021-12-11 DIAGNOSIS — K643 Fourth degree hemorrhoids: Secondary | ICD-10-CM | POA: Diagnosis not present

## 2021-12-11 MED ORDER — PROCTOFOAM HC 1-1 % EX FOAM: 1.0000 | Freq: Four times a day (QID) | CUTANEOUS | 0 refills | Status: AC

## 2021-12-11 MED ORDER — OXYCODONE HCL 5 MG PO TABS: 5.0000 mg | ORAL_TABLET | ORAL | 0 refills | Status: AC | PRN

## 2021-12-11 NOTE — Telephone Encounter (Signed)
PA Submitted through [email or web address] and received the following:  Your information has been submitted to [employer] [place]. [employer] [place] will review the request and notify you of the determination decision directly, typically within 72 hours of receiving all information.  You will also receive your request decision electronically. To check for an update later, open this request again from your dashboard.  If [employer] [place] has not responded within the specified timeframe or if you have any questions about your PA submission, contact [employer] [place] directly at [phone]. 
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
## 2021-12-11 NOTE — Patient Instructions (Addendum)
Surgical Procedures for Hemorrhoids Surgical procedures can be used to treat hemorrhoids. Hemorrhoids are swollen veins that are inside the rectum (internal hemorrhoids) or around the anus (external hemorrhoids). They are caused by increased pressure in the anal area. This pressure may result from straining to have a bowel movement (constipation), diarrhea, pregnancy, obesity, or sitting for long periods of time. Hemorrhoids can cause symptoms such as pain and bleeding. Surgery may be needed if diet changes, lifestyle changes, and other treatments do not help your symptoms. Common surgical methods that may be used include: Closed hemorrhoidectomy. The hemorrhoids are surgically removed, and the incisions are closed with stitches (sutures). Open hemorrhoidectomy. The hemorrhoids are surgically removed, but the incisions are allowed to heal without sutures. Stapled hemorrhoidectomy. The hemorrhoids are partially removed, and the incisions are closed with staples. Tell a health care provider about: Any allergies you have. All medicines you are taking, including vitamins, herbs, eye drops, creams, and over-the-counter medicines. Any problems you or family members have had with anesthetic medicines. Any blood disorders you have. Any surgeries you have had. Any medical conditions you have. Whether you are pregnant or may be pregnant. What are the risks? Generally, this is a safe procedure. However, problems may occur, including: Infection. Bleeding. Allergic reactions to medicines. Damage to other structures or organs. Pain. Constipation. Difficulty passing urine. Narrowing of the anal canal (stenosis). Difficulty controlling bowel movements (incontinence). Recurring hemorrhoids. A new passage (fistula) that forms between the anus or rectum and another area. What happens before the procedure? Medicines Ask your health care provider about: Changing or stopping your regular medicines. This is  especially important if you are taking diabetes medicines or blood thinners. Taking medicines such as aspirin and ibuprofen. These medicines can thin your blood. Do not take these medicines unless your health care provider tells you to take them. Taking over-the-counter medicines, vitamins, herbs, and supplements. General instructions You may need to have a procedure to examine the inside of your colon with a scope (colonoscopy). Your health care provider may do this to make sure that there are no other causes for your bleeding or pain. You may be instructed to take a laxative and an enema to clean out your colon before surgery (bowel prep). Carefully follow instructions from your health care provider about bowel prep. Plan to have someone take you home from the hospital or clinic. Plan to have a responsible adult care for you for at least 24 hours after you leave the hospital or clinic. This is important. Ask your health care provider: How your surgery site will be marked. What steps will be taken to help prevent infection. These may include: Washing skin with a germ-killing soap. Taking antibiotic medicine. What happens during the procedure? An IV will be inserted into one of your veins. You will be given one or more of the following: A medicine to help you relax (sedative). A medicine to numb the area (local anesthetic). A medicine to make you fall asleep (general anesthetic). A medicine that is injected into an area of your body to numb everything below the injection site (regional anesthetic). A lubricating jelly may be placed into your rectum. Your surgeon will insert a short scope (anoscope) into your rectum to examine the hemorrhoids. One of the following surgical methods will be used to remove the hemorrhoids: Closed hemorrhoidectomy. Your surgeon will use surgical instruments to open the tissue around the hemorrhoids. The veins that supply the hemorrhoids will be tied off with a  suture.   The hemorrhoids will be removed. The tissue that surrounds the hemorrhoids will be closed with sutures that your body can absorb (absorbable sutures). Open hemorrhoidectomy. The hemorrhoids will be removed with surgical instruments. The incisions will be left open to heal without sutures. Stapled hemorrhoidectomy. Your surgeon will use a circular stapling device to partially remove the hemorrhoids. The device will be inserted into your anus. It will remove a circular ring of tissue that includes hemorrhoid tissue and some tissue above the hemorrhoids. The staples in the device will close the edges of the tissue. This will cut off the blood supply to any remaining hemorrhoids and pull the tissue back into place. Each of these procedures may vary among health care providers and hospitals. What happens after the procedure? Your blood pressure, heart rate, breathing rate, and blood oxygen level may be monitored until you leave the hospital or clinic. You will be given pain medicine as needed. Do not drive for 24 hours if you were given a sedative during your procedure. Summary Surgery may be needed for hemorrhoids if diet changes, lifestyle changes, and other treatments do not help your symptoms. There are three common methods of surgery that are used to treat hemorrhoids. Follow instructions from your health care provider about taking medicines and about eating and drinking before the procedure. You may be instructed to take a laxative and an enema to clean out your colon before surgery (bowel prep). This information is not intended to replace advice given to you by your health care provider. Make sure you discuss any questions you have with your health care provider. Document Revised: 10/10/2020 Document Reviewed: 10/10/2020 Elsevier Patient Education  2023 Elsevier Inc.  

## 2021-12-12 ENCOUNTER — Telehealth: Payer: Self-pay | Admitting: *Deleted
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
## 2021-12-12 NOTE — Telephone Encounter (Signed)
Procedure Date: 12/11/2021 Procedure: I&D thrombosed hemorrhoid  Call placed to patient to follow up [phone]~ telephone. Attempted to contact patient x2. Left message on VM.

## 2021-12-13 NOTE — Telephone Encounter (Signed)
Awaiting decision.   Insurance denied stating that he needed to try Hydrocortisone 2.5mg  rectal cream first. Appeal letter faxed to [facility] at [phone] stating that ProctoFoam was not the same that it had an added ingredient in it - pramoxine that is a local anesthetic that helps with pain.

## 2021-12-13 NOTE — Telephone Encounter (Signed)
Call placed to patient.   Patient reports that there is still pain, but it is much improved. States that bleeding is controlled.   Advised to contact office with any further concerns.

## 2021-12-18 ENCOUNTER — Other Ambulatory Visit (HOSPITAL_COMMUNITY): Payer: BLUE CROSS/BLUE SHIELD

## 2021-12-19 ENCOUNTER — Telehealth: Payer: Self-pay | Admitting: *Deleted

## 2021-12-19 NOTE — Telephone Encounter (Signed)
Received call from patient spouse, Asher Muir [phone] telephone.   Reports that patient will be seeing [facility] in [place] as [facility] is out of network. Requested referral to be sent to [facility].   Call placed to patient. Advised that current BCBS plan does not require referral and [facility] can see [facility] notes.

## 2021-12-23 ENCOUNTER — Encounter (HOSPITAL_COMMUNITY): Payer: BLUE CROSS/BLUE SHIELD

## 2021-12-24 NOTE — Telephone Encounter (Signed)
Patient has elected to go to another practice due to ins not covering [place].

## 2021-12-25 ENCOUNTER — Ambulatory Visit (HOSPITAL_COMMUNITY)
Admission: RE | Admit: 2021-12-25 | Payer: BLUE CROSS/BLUE SHIELD | Source: Ambulatory Visit | Admitting: General Surgery

## 2021-12-25 ENCOUNTER — Encounter (HOSPITAL_COMMUNITY): Admission: RE | Payer: Self-pay | Source: Ambulatory Visit

## 2021-12-25 DIAGNOSIS — K643 Fourth degree hemorrhoids: Secondary | ICD-10-CM | POA: Diagnosis present

## 2021-12-25 SURGERY — HEMORRHOIDECTOMY
Anesthesia: General

## 2022-08-05 ENCOUNTER — Other Ambulatory Visit: Payer: Self-pay

## 2022-08-05 ENCOUNTER — Emergency Department (HOSPITAL_COMMUNITY)
Admission: EM | Admit: 2022-08-05 | Discharge: 2022-08-05 | Disposition: A | Discharge status: Home / Self Care | Payer: BLUE CROSS/BLUE SHIELD | Attending: Emergency Medicine | Admitting: Emergency Medicine

## 2022-08-05 ENCOUNTER — Emergency Department (HOSPITAL_COMMUNITY): Payer: BLUE CROSS/BLUE SHIELD

## 2022-08-05 DIAGNOSIS — Z79899 Other long term (current) drug therapy: Secondary | ICD-10-CM | POA: Diagnosis not present

## 2022-08-05 DIAGNOSIS — Z7982 Long term (current) use of aspirin: Secondary | ICD-10-CM | POA: Insufficient documentation

## 2022-08-05 DIAGNOSIS — I2510 Atherosclerotic heart disease of native coronary artery without angina pectoris: Secondary | ICD-10-CM | POA: Diagnosis not present

## 2022-08-05 DIAGNOSIS — S060X1A Concussion with loss of consciousness of 30 minutes or less, initial encounter: Secondary | ICD-10-CM | POA: Insufficient documentation

## 2022-08-05 DIAGNOSIS — Y990 Civilian activity done for income or pay: Secondary | ICD-10-CM | POA: Insufficient documentation

## 2022-08-05 DIAGNOSIS — S0990XA Unspecified injury of head, initial encounter: Secondary | ICD-10-CM | POA: Diagnosis present

## 2022-08-05 DIAGNOSIS — I10 Essential (primary) hypertension: Secondary | ICD-10-CM | POA: Insufficient documentation

## 2022-08-05 DIAGNOSIS — W228XXA Striking against or struck by other objects, initial encounter: Secondary | ICD-10-CM | POA: Insufficient documentation

## 2022-08-05 NOTE — Discharge Instructions (Signed)
Take ibuprofen and/or Tylenol as needed for headache.  Avoid activities that worsen headaches, dizziness, nausea, or other postconcussive symptoms.  Avoid further head injuries while you are healing from this concussion.  Return the emergency department for any new or worsening symptoms of concern.

## 2022-08-05 NOTE — ED Provider Notes (Signed)
[place] EMERGENCY DEPARTMENT AT [facility] Provider Note   CSN: [id number] Arrival date & time: 08/05/22  0457     History  Chief Complaint  Patient presents with   Head Injury    Wesley Newman is a 42 y.o. male.   Head Injury Associated symptoms: headache and nausea   Patient presents for head injury.  Medical history includes HLD, CAD, HTN..  Last night, patient was struck on the left side of the head by a metal bar while working.  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  A bar that was approximately 1 inch in diameter, and under tension, was released striking him in the left temporal area.  He does believe that he lost consciousness.  He has since had headache and nausea.  He is not on any blood thinners.     Home Medications Prior to Admission medications   Medication Sig Start Date End Date Taking? Authorizing Provider  acetaminophen (TYLENOL) 500 MG tablet Take 1,000 mg by mouth every 6 (six) hours as needed for moderate pain or headache.     [provider]  aspirin 81 MG chewable tablet Chew 81 mg by mouth daily.    [provider]  atorvastatin (LIPITOR) 40 MG tablet Take 1 tablet (40 mg total) by mouth daily. Patient not taking: Reported on 12/24/2020 02/07/20   Abelino Derrick, PA-C  carvedilol (COREG) 6.25 MG tablet Take 1 tablet (6.25 mg total) by mouth 2 (two) times daily. 01/21/21   Jonelle Sidle, MD  isosorbide mononitrate (IMDUR) 30 MG 24 hr tablet Take 0.5 tablets (15 mg total) by mouth daily. 01/21/21   Jonelle Sidle, MD  lisinopril (ZESTRIL) 40 MG tablet Take 1 tablet (40 mg total) by mouth daily. 12/24/20 03/24/21  Netta Neat., NP  nitroGLYCERIN (NITROSTAT) 0.4 MG SL tablet Place 1 tablet (0.4 mg total) under the tongue every 5 (five) minutes as needed for chest pain. Patient not taking: Reported on 12/24/2020 02/07/20   Abelino Derrick, PA-C  nitroGLYCERIN (NITROSTAT) 0.4 MG SL  tablet Place 1 tablet (0.4 mg total) under the tongue every 5 (five) minutes as needed for chest pain. Patient not taking: Reported on 12/24/2020 03/10/20 03/10/21  Marcelino Duster, PA  oxyCODONE (ROXICODONE) 5 MG immediate release tablet Take 1 tablet (5 mg total) by mouth every 4 (four) hours as needed for severe pain or breakthrough pain. 12/11/21   Lucretia Roers, MD  pantoprazole (PROTONIX) 20 MG tablet Take 1 tablet (20 mg total) by mouth daily. Patient not taking: Reported on 12/11/2021 12/24/20   Netta Neat., NP      Allergies    Patient has no known allergies.    Review of Systems   Review of Systems  Gastrointestinal:  Positive for nausea.  Neurological:  Positive for headaches.  All other systems reviewed and are negative.   Physical Exam Updated Vital Signs BP (!) 189/124   Pulse 84   Temp 98.2 F (36.8 C)   Resp 19   Ht  (1.803 m)   Wt 95.3 kg   SpO2 97%   BMI 29.29 kg/m  Physical Exam Vitals and nursing note reviewed.  Constitutional:      General: He is not in acute distress.    Appearance: Normal appearance. He is well-developed. He is not ill-appearing, toxic-appearing or diaphoretic.  HENT:     Head: Normocephalic.  Comments: Tenderness to left temporal area.  No swelling or break in skin.    Right Ear: External ear normal.     Left Ear: External ear normal.     Nose: Nose normal.     Mouth/Throat:     Mouth: Mucous membranes are moist.  Eyes:     Extraocular Movements: Extraocular movements intact.     Conjunctiva/sclera: Conjunctivae normal.  Cardiovascular:     Rate and Rhythm: Normal rate and regular rhythm.  Pulmonary:     Effort: Pulmonary effort is normal. No respiratory distress.  Abdominal:     General: There is no distension.     Palpations: Abdomen is soft.  Musculoskeletal:        General: No swelling. Normal range of motion.     Cervical back: Normal range of motion and neck supple. No tenderness.  Skin:     General: Skin is warm and dry.     Coloration: Skin is not jaundiced or pale.  Neurological:     General: No focal deficit present.     Mental Status: He is alert and oriented to person, place, and time.     Cranial Nerves: No cranial nerve deficit.     Sensory: No sensory deficit.     Motor: No weakness.     Coordination: Coordination normal.  Psychiatric:        Mood and Affect: Mood normal.        Behavior: Behavior normal.        Thought Content: Thought content normal.        Judgment: Judgment normal.     ED Results / Procedures / Treatments   Labs (all labs ordered are listed, but only abnormal results are displayed) Labs Reviewed - No data to display  EKG None  Radiology CT Head Wo Contrast  Result Date: 08/05/2022 CLINICAL DATA:  Head trauma with abnormal mental status EXAM: CT HEAD WITHOUT CONTRAST TECHNIQUE: Contiguous axial images were obtained from the base of the skull through the vertex without intravenous contrast. RADIATION DOSE REDUCTION: This exam was performed according to the departmental dose-optimization program which includes automated exposure control, adjustment of the mA and/or kV according to patient size and/or use of iterative reconstruction technique. COMPARISON:  Head CT 07/26/2013 FINDINGS: Brain: No evidence of acute infarction, hemorrhage, hydrocephalus, extra-axial collection or mass lesion/mass effect. Vascular: No hyperdense vessel or unexpected calcification. Skull: Normal. Negative for fracture or focal lesion. Sinuses/Orbits: No acute finding. IMPRESSION: Normal head CT. Electronically Signed   By: Tiburcio Pea M.D.   On: 08/05/2022 05:45    Procedures Procedures    Medications Ordered in ED Medications - No data to display  ED Course/ Medical Decision Making/ A&P                             Medical Decision Making  Patient presents after head injury.  This occurred last night while he was working.  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  He does believe that he lost consciousness.  He has since had left temporal pain as well as nausea.  He denies any other areas of discomfort or any other associated symptoms.  Vital signs on arrival are notable for hypertension.  On exam, patient is well-appearing.  There are no areas of swelling or skin breakage on his scalp.  He does have left temporal tenderness.  He does not have any focal neurologic deficits on exam.  He is alert and oriented.  CT imaging shows no acute findings.  Patient declines any medications for analgesia or nausea.  He states that he will take pain medication at home.  He was counseled on postconcussive syndrome.  He was discharged in stable condition.        Final Clinical Impression(s) / ED Diagnoses Final diagnoses:  Injury of head, initial encounter  Concussion with loss of consciousness of 30 minutes or less, initial encounter    Rx / DC Orders ED Discharge Orders     None         Gloris Manchester, MD 08/05/22 [phone]

## 2022-08-05 NOTE — ED Notes (Signed)
Went to discharge pt and pt not in room
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
## 2022-08-05 NOTE — ED Notes (Signed)
Pt not in room.

## 2024-02-14 ENCOUNTER — Emergency Department (HOSPITAL_COMMUNITY)

## 2024-02-14 ENCOUNTER — Other Ambulatory Visit: Payer: Self-pay

## 2024-02-14 ENCOUNTER — Emergency Department (HOSPITAL_COMMUNITY)
Admission: EM | Admit: 2024-02-14 | Discharge: 2024-02-14 | Disposition: A | Discharge status: Home / Self Care | Attending: Emergency Medicine | Admitting: Emergency Medicine

## 2024-02-14 DIAGNOSIS — I2510 Atherosclerotic heart disease of native coronary artery without angina pectoris: Secondary | ICD-10-CM | POA: Diagnosis not present

## 2024-02-14 DIAGNOSIS — F17210 Nicotine dependence, cigarettes, uncomplicated: Secondary | ICD-10-CM | POA: Insufficient documentation

## 2024-02-14 DIAGNOSIS — R062 Wheezing: Secondary | ICD-10-CM | POA: Insufficient documentation

## 2024-02-14 DIAGNOSIS — Z79899 Other long term (current) drug therapy: Secondary | ICD-10-CM | POA: Insufficient documentation

## 2024-02-14 DIAGNOSIS — Z7982 Long term (current) use of aspirin: Secondary | ICD-10-CM | POA: Diagnosis not present

## 2024-02-14 DIAGNOSIS — R0789 Other chest pain: Secondary | ICD-10-CM | POA: Insufficient documentation

## 2024-02-14 DIAGNOSIS — R079 Chest pain, unspecified: Secondary | ICD-10-CM

## 2024-02-14 DIAGNOSIS — I10 Essential (primary) hypertension: Secondary | ICD-10-CM | POA: Insufficient documentation

## 2024-02-14 LAB — BASIC METABOLIC PANEL WITH GFR
Anion gap: 10 (ref 5–15)
BUN: 7 mg/dL (ref 6–20)
CO2: 25 mmol/L (ref 22–32)
Calcium: 8.9 mg/dL (ref 8.9–10.3)
Chloride: 104 mmol/L (ref 98–111)
Creatinine, Ser: 0.86 mg/dL (ref 0.61–1.24)
GFR, Estimated: >60 mL/min (ref >60)
Glucose, Bld: 103 mg/dL — ABNORMAL HIGH (ref 70–99)
Potassium: 4 mmol/L (ref 3.5–5.1)
Sodium: 139 mmol/L (ref 135–145)

## 2024-02-14 LAB — CBC
HCT: 46.6 % (ref 39.0–52.0)
Hemoglobin: 16 g/dL (ref 13.0–17.0)
MCH: 30 pg (ref 26.0–34.0)
MCHC: 34.3 g/dL (ref 30.0–36.0)
MCV: 87.3 fL (ref 80.0–100.0)
Platelets: 242 K/uL (ref 150–400)
RBC: 5.34 MIL/uL (ref 4.22–5.81)
RDW: 13.3 % (ref 11.5–15.5)
WBC: 7.8 K/uL (ref 4.0–10.5)
nRBC: 0 % (ref 0.0–0.2)

## 2024-02-14 LAB — TROPONIN T, HIGH SENSITIVITY
Troponin T High Sensitivity: <15 ng/L (ref 0–19)
Troponin T High Sensitivity: <15 ng/L (ref 0–19)

## 2024-02-14 MED ORDER — FAMOTIDINE 20 MG PO TABS
20.0000 mg | ORAL_TABLET | Freq: Once | ORAL | Status: AC
  Administered 2024-02-14: 20 mg via ORAL
  Filled 2024-02-14: qty 1

## 2024-02-14 MED ORDER — ALUM & MAG HYDROXIDE-SIMETH 200-200-20 MG/5ML PO SUSP
30.0000 mL | Freq: Once | ORAL | Status: AC
  Administered 2024-02-14: 30 mL via ORAL
  Filled 2024-02-14: qty 30

## 2024-02-14 MED ORDER — LIDOCAINE VISCOUS HCL 2 % MT SOLN
15.0000 mL | Freq: Once | OROMUCOSAL | Status: AC
  Administered 2024-02-14: 15 mL via ORAL
  Filled 2024-02-14: qty 15

## 2024-02-14 NOTE — ED Provider Notes (Cosign Needed Addendum)
 [place] [facility] DE[facility]MENT AT [facility] Provider Note   CSN: [id number] Arrival date & time: 02/14/24  9167     Patient presents with: Chest Pain   Wesley Newman is a 43 y.o. male who presents to the emergency department by EMS with a chief complaint of left-sided chest pain.  Patient states that he began to experience severe left-sided chest pain at approximately 6:15 AM this morning.  Patient states that this pain began whenever he was having a bowel movement however denies exerting himself when the pain started.  Patient states that the first wave of pain lasted approximately an hour and he decided to take one of his prescribed nitroglycerin  tablets at 7:15 AM.  He states that the pain was somewhat relieved with the nitroglycerin  however returned and he took a second nitroglycerin  at approximately 7:35 AM.  Patient states that once again the pain was somewhat relieved however returned so he decided to call EMS.  Patient states that while en route with EMS the pain was episodic and would come and go and has continued to do so since being in the hospital.  Patient states that the episodes of pain now last 2 to 3 minutes or shorter lasting 30 seconds.  Patient does have a past medical history significant for cardiac stent back in 2017, other medical history significant for hyperlipidemia, coronary artery disease, hypertension, tobacco abuse.  Patient states that he has not taken any of his prescribed blood pressure medications today. Patient  was given aspirin  en route by EMS.  States that he smokes approximately 1 pack of cigarettes per day.  Denies fever, chills, cough, shortness of breath, abdominal pain, nausea, vomiting.  Patient denies radiation of the pain stating that is just left-sided chest pain over his left nipple area.  Denies trauma/injury. Denies cocaine or other drug use. At time of obtaining history patient states that he is pain free.     Chest Pain      Prior  to Admission medications   Medication Sig Start Date End Date Taking? Authorizing Provider  acetaminophen  (TYLENOL ) 500 MG tablet Take 1,000 mg by mouth every 6 (six) hours as needed for moderate pain or headache.     [provider]  aspirin  81 MG chewable tablet Chew 81 mg by mouth daily.    [provider]  atorvastatin  (LIPITOR) 40 MG tablet Take 1 tablet (40 mg total) by mouth daily. Patient not taking: Reported on 12/24/2020 02/07/20   Kilroy, Luke K, PA-C  carvedilol  (COREG ) 6.25 MG tablet Take 1 tablet (6.25 mg total) by mouth 2 (two) times daily. 01/21/21   Debera Jayson KANDICE, MD  isosorbide  mononitrate (IMDUR ) 30 MG 24 hr tablet Take 0.5 tablets (15 mg total) by mouth daily. 01/21/21   Debera Jayson KANDICE, MD  lisinopril  (ZESTRIL ) 40 MG tablet Take 1 tablet (40 mg total) by mouth daily. 12/24/20 03/24/21  Richarda Prentice LITTIE Mickey., NP  nitroGLYCERIN  (NITROSTAT ) 0.4 MG SL tablet Place 1 tablet (0.4 mg total) under the tongue every 5 (five) minutes as needed for chest pain. Patient not taking: Reported on 12/24/2020 02/07/20   Kilroy, Luke K, PA-C  nitroGLYCERIN  (NITROSTAT ) 0.4 MG SL tablet Place 1 tablet (0.4 mg total) under the tongue every 5 (five) minutes as needed for chest pain. Patient not taking: Reported on 12/24/2020 03/10/20 03/10/21  Madie Jon Garre, PA  oxyCODONE  (ROXICODONE ) 5 MG immediate release tablet Take 1 tablet (5 mg total) by mouth every 4 (four)  hours as needed for severe pain or breakthrough pain. 12/11/21   Kallie Manuelita BROCKS, MD  pantoprazole  (PROTONIX ) 20 MG tablet Take 1 tablet (20 mg total) by mouth daily. Patient not taking: Reported on 12/11/2021 12/24/20   Richarda Prentice LITTIE Mickey., NP    Allergies: Patient has no known allergies.    Review of Systems  Cardiovascular:  Positive for chest pain.    Updated Vital Signs BP 133/81   Pulse 60   Temp 97.9 F (36.6 C) (Oral)   Resp 11   Ht 5' 11 (1.803 m)   Wt 91.2 kg   SpO2 98%   BMI 28.03 kg/m    Physical Exam Vitals and nursing note reviewed.  Constitutional:      General: He is awake. He is not in acute distress.    Appearance: Normal appearance. He is well-developed. He is not ill-appearing, toxic-appearing or diaphoretic.  HENT:     Head: Normocephalic and atraumatic.  Eyes:     General: No scleral icterus. Cardiovascular:     Rate and Rhythm: Normal rate and regular rhythm.     Heart sounds: Normal heart sounds.  Pulmonary:     Effort: No tachypnea or respiratory distress.     Breath sounds: No stridor. Wheezing (Mild wheezing present, suspect chronic change due to smoking hx) present. No rhonchi or rales.  Chest:     Chest wall: No tenderness.  Abdominal:     Palpations: Abdomen is soft.     Tenderness: There is no abdominal tenderness. There is no guarding or rebound.  Musculoskeletal:     Cervical back: Normal range of motion.     Right lower leg: No edema.     Left lower leg: No edema.  Skin:    General: Skin is warm.     Capillary Refill: Capillary refill takes less than 2 seconds.  Neurological:     General: No focal deficit present.     Mental Status: He is alert and oriented to person, place, and time.  Psychiatric:        Mood and Affect: Mood normal.        Behavior: Behavior normal. Behavior is cooperative.     (all labs ordered are listed, but only abnormal results are displayed) Labs Reviewed  BASIC METABOLIC PANEL WITH GFR - Abnormal; Notable for the following components:      Result Value   Glucose, Bld 103 (*)    All other components within normal limits  CBC  TROPONIN T, HIGH SENSITIVITY  TROPONIN T, HIGH SENSITIVITY    EKG: EKG Interpretation Date/Time:  Sunday February 14 2024 08:41:02 EST Ventricular Rate:  73 PR Interval:  129 QRS Duration:  104 QT Interval:  398 QTC Calculation: 439 R Axis:   35  Text Interpretation: Sinus rhythm Abnormal R-wave progression, early transition No acute changes No significant change since  last tracing Confirmed by Charlyn Sora ([id number]) on 02/14/2024 12:03:33 PM  Radiology: DG Chest 2 View Result Date: 02/14/2024 EXAM: PA AND LATERAL (2) VIEW(S) XRAY OF THE CHEST 02/14/2024 09:12:06 AM COMPARISON: PA and lateral radiographs of the chest dated 04/15/2023. CLINICAL HISTORY: chest pain FINDINGS: LUNGS AND PLEURA: No focal pulmonary opacity. No pulmonary edema. No pleural effusion. No pneumothorax. HEART AND MEDIASTINUM: No acute abnormality of the cardiac and mediastinal silhouettes. BONES AND SOFT TISSUES: No acute osseous abnormality. IMPRESSION: 1. No acute process. Electronically signed by: Evalene Coho MD 02/14/2024 09:22 AM EST RP Workstation: [id number]  Procedures   Medications Ordered in the ED  alum & mag hydroxide-simeth (MAALOX/MYLANTA) 200-200-20 MG/5ML suspension 30 mL (30 mLs Oral Given 02/14/24 0957)    And  lidocaine  (XYLOCAINE ) 2 % viscous mouth solution 15 mL (15 mLs Oral Given 02/14/24 0957)  famotidine  (PEPCID ) tablet 20 mg (20 mg Oral Given 02/14/24 0955)                                    Medical Decision Making Amount and/or Complexity of Data Reviewed Labs: ordered. Radiology: ordered.  Risk OTC drugs. Prescription drug management.   Patient presents to the ED for concern of chest pain, this involves an extensive number of treatment options, and is a complaint that carries with it a high risk of complications and morbidity.  The differential diagnosis includes ACS, pneumothorax, pneumonia, GERD, trauma/injury, musculoskeletal pain, etc.   Co morbidities that complicate the patient evaluation  Hypertension, hyperlipidemia, tobacco abuse, coronary artery disease   Additional history obtained:  Reviewed previous heart catheterization report from April 2025 which showed mild nonobstructive coronary artery disease, exercise nuclear stress test (05/13/22) which showed that the patient's exercise capacity was excellent for age with the patient  experiencing non-limited angina, also reviewed prior cardiology notes from [facility]  Lab Tests:  I Ordered, and personally interpreted labs.  The pertinent results include: DBC unremarkable, BMP unremarkable, troponins less than 15 x 2   Imaging Studies ordered:  I ordered imaging studies including chest x-ray I independently visualized and interpreted imaging which showed no acute process I agree with the radiologist interpretation   Cardiac Monitoring:  The patient was maintained on a cardiac monitor.  I personally viewed and interpreted the cardiac monitored which showed an underlying rhythm of: sinus rhythm   Medicines ordered and prescription drug management:  I ordered medication including Maalox, Pepcid  for chest pain Reevaluation of the patient after these medicines showed that the patient stayed the same I have reviewed the patients home medicines and have made adjustments as needed   Test Considered:  None   Critical Interventions:  None   Problem List / ED Course:  43 year old male, vital signs stable although mildly hypertensive, presents via EMS due to left-sided chest pain, history of previous cardiac stent back in 2017, hypertension, hyperlipidemia, tobacco abuse On physical exam patient overall well-appearing, pain-free at this time, physical exam overall reassuring, chest pain non-reproducible EKG from EMS does not show any acute ischemic changes or ST segment elevation by my interpretation, EKGs repeated here as well do not show STEMI and no significant change since prior tracing Will obtain general chest pain labs including troponin, will treat with GI cocktail and reassess for improvement Chest pain workup reassuring, troponins less than 15 x 2, chest x-ray reassuring, patient did not have improvement after GI cocktail however chest pain stopped occurring prior to discharge with no more episodes Patient educated on workup and findings, at this time I do not  see a cardiac etiology for chest pain with negative troponins and EKGs that are consistent with previous, patient understanding of this, calculated patient's heart score which I calculated is 3 due to moderately suspicious story as chest pain was relieved by nitro and greater than 3 risk factors, putting the patient's risk of MACE in the low score category After discussion with attending physician I feel patient is stable for discharge at this time as patient has good outpatient follow-up and  is established with cardiology, extensive return precautions given and patient discharged Unknown cause for chest pain at this time however no signs of cardiac etiology of chest pain and patient was pain-free when leaving the hospital   Reevaluation:  After the interventions noted above, I reevaluated the patient and found that they have :improved   Social Determinants of Health:  none   Dispostion:  After consideration of the diagnostic results and the patients response to treatment, I feel that the patient would benefit from discharge from the hospital and close follow-up with primary care provider as well as cardiology.  Smoking cessation as soon as possible.     Final diagnoses:  Chest pain, unspecified type    ED Discharge Orders     None          Janetta Terrall FALCON, PA-C 02/14/24 1959    Jaydien Panepinto F, PA-C 02/14/24 2018    Charlyn Sora, MD 02/15/24 [phone]

## 2024-02-14 NOTE — Discharge Instructions (Addendum)
 It was a pleasure taking care of you today.  Based on your history, physical exam, labs, and imaging I feel you are safe for discharge.  Today there was no indication that your chest pain is related to your heart.  Please continue to monitor your symptoms, if chest pain worsens, changes, or you experience other concerning symptoms including but not limited to fever, chills, shortness of breath, nausea, vomiting, unexplained weakness, or other concerning symptom please return to the emergency department or seek further medical care.  You may take over-the-counter Tylenol  to try and help with your symptoms. Please do not exceed the max daily limit of 4000mg  of tylenol  in a single day or more than 1000mg  in a single dose. Please make your cardiologist as well as primary care provider aware of your visit today, workup, and findings. If symptoms persist or worsen recommend follow-up with your primary care or back in the ED within 48 hours.

## 2024-02-14 NOTE — ED Triage Notes (Signed)
 Pt arrived [facility] from home with c/o left sided chest since 615am. Pt took 2 nitroglycerin  0715 & 0735. Pt had  stents placed in 2017. CBG 177 [facility] gave ASA 324  left chest pain 8 out 10 20 G Right hand
